# Patient Record
Sex: Female | Born: 1985 | Race: White | Hispanic: No | Marital: Single | Smoking: Former smoker
Health system: Southern US, Community
[De-identification: ages and names within clinical notes are randomized; demographics above are authoritative.]

## PROBLEM LIST (undated history)

## (undated) DIAGNOSIS — F909 Attention-deficit hyperactivity disorder, unspecified type: Secondary | ICD-10-CM

## (undated) DIAGNOSIS — Z789 Other specified health status: Secondary | ICD-10-CM

## (undated) DIAGNOSIS — E039 Hypothyroidism, unspecified: Secondary | ICD-10-CM

## (undated) HISTORY — PX: KNEE ARTHROSCOPY: SHX127

## (undated) HISTORY — PX: KNEE SURGERY: SHX244

---

## 2012-01-31 ENCOUNTER — Emergency Department (HOSPITAL_COMMUNITY)
Admission: EM | Admit: 2012-01-31 | Discharge: 2012-01-31 | Disposition: A | Discharge status: Home / Self Care | Payer: Self-pay | Attending: Emergency Medicine | Admitting: Emergency Medicine

## 2012-01-31 ENCOUNTER — Encounter (HOSPITAL_COMMUNITY): Payer: Self-pay | Admitting: *Deleted

## 2012-01-31 ENCOUNTER — Emergency Department (HOSPITAL_COMMUNITY): Payer: Self-pay

## 2012-01-31 DIAGNOSIS — R109 Unspecified abdominal pain: Secondary | ICD-10-CM | POA: Insufficient documentation

## 2012-01-31 DIAGNOSIS — N12 Tubulo-interstitial nephritis, not specified as acute or chronic: Secondary | ICD-10-CM | POA: Insufficient documentation

## 2012-01-31 LAB — CBC
Hemoglobin: 11.1 g/dL — ABNORMAL LOW (ref 12.0–15.0)
MCHC: 33.5 g/dL (ref 30.0–36.0)
RDW: 12.7 % (ref 11.5–15.5)
WBC: 10.3 10*3/uL (ref 4.0–10.5)

## 2012-01-31 LAB — URINALYSIS, ROUTINE W REFLEX MICROSCOPIC
Glucose, UA: NEGATIVE mg/dL
Leukocytes, UA: NEGATIVE
Nitrite: POSITIVE — AB
Protein, ur: 30 mg/dL — AB
pH: 6 (ref 5.0–8.0)

## 2012-01-31 LAB — URINE MICROSCOPIC-ADD ON

## 2012-01-31 LAB — POCT I-STAT, CHEM 8
Creatinine, Ser: 0.7 mg/dL (ref 0.50–1.10)
Glucose, Bld: 80 mg/dL (ref 70–99)
HCT: 33 % — ABNORMAL LOW (ref 36.0–46.0)
Hemoglobin: 11.2 g/dL — ABNORMAL LOW (ref 12.0–15.0)
TCO2: 23 mmol/L (ref 0–100)

## 2012-01-31 LAB — PREGNANCY, URINE: Preg Test, Ur: NEGATIVE

## 2012-01-31 MED ORDER — DEXTROSE 5 % IV SOLN
1.0000 g | Freq: Once | INTRAVENOUS | Status: AC
  Administered 2012-01-31: 1 g via INTRAVENOUS
  Filled 2012-01-31: qty 10

## 2012-01-31 MED ORDER — PERCOCET 5-325 MG PO TABS: 1.0000 | ORAL_TABLET | Freq: Four times a day (QID) | ORAL | Status: AC | PRN

## 2012-01-31 MED ORDER — ONDANSETRON 8 MG PO TBDP
8.0000 mg | ORAL_TABLET | Freq: Once | ORAL | Status: DC
  Filled 2012-01-31: qty 1

## 2012-01-31 MED ORDER — MORPHINE SULFATE 4 MG/ML IJ SOLN
4.0000 mg | Freq: Once | INTRAMUSCULAR | Status: AC
  Administered 2012-01-31: 4 mg via INTRAVENOUS
  Filled 2012-01-31: qty 1

## 2012-01-31 MED ORDER — SODIUM CHLORIDE 0.9 % IV BOLUS (SEPSIS)
1000.0000 mL | Freq: Once | INTRAVENOUS | Status: AC
  Administered 2012-01-31: 1000 mL via INTRAVENOUS

## 2012-01-31 MED ORDER — SODIUM CHLORIDE 0.9 % IV BOLUS (SEPSIS)
500.0000 mL | INTRAVENOUS | Status: AC
  Administered 2012-01-31: 500 mL via INTRAVENOUS

## 2012-01-31 MED ORDER — CEPHALEXIN 500 MG PO CAPS: 500.0000 mg | ORAL_CAPSULE | Freq: Four times a day (QID) | ORAL | Status: AC

## 2012-01-31 MED ORDER — OXYCODONE-ACETAMINOPHEN 5-325 MG PO TABS
2.0000 | ORAL_TABLET | Freq: Once | ORAL | Status: DC
  Filled 2012-01-31: qty 2

## 2012-01-31 MED ORDER — ONDANSETRON HCL 4 MG/2ML IJ SOLN
4.0000 mg | Freq: Once | INTRAMUSCULAR | Status: AC
  Administered 2012-01-31: 4 mg via INTRAVENOUS
  Filled 2012-01-31: qty 2

## 2012-01-31 MED ORDER — KETOROLAC TROMETHAMINE 30 MG/ML IJ SOLN
30.0000 mg | Freq: Once | INTRAMUSCULAR | Status: AC
  Administered 2012-01-31: 30 mg via INTRAVENOUS
  Filled 2012-01-31: qty 1

## 2012-01-31 NOTE — ED Provider Notes (Signed)
History     CSN: [id number]  Arrival date & time 01/31/12  4098   First MD Initiated Contact with Patient 01/31/12 0730      Chief Complaint  Patient presents with  . Flank Pain    (Consider location/radiation/quality/duration/timing/severity/associated sxs/prior treatment) HPI Comments: Patient with a significant past medical history presents emergency department with chief complaint of right-sided flank pain.  Onset of symptoms began on Friday, location is right flank, pain does not radiate, associated symptoms include nausea, decreased appetite, dysuria, urinary frequency.  Patient denies any abnormal vaginal discharge, dyspareunia, hematuria or vaginal bleeding.  Patient states that she is unaware whether the back pain or urinary symptoms began first.  She denies fevers, night sweats, chills, emesis.   Patient is a 26 y.o. female presenting with flank pain. The history is provided by the patient.  Flank Pain Pertinent negatives include no abdominal pain, chest pain, chills, coughing, diaphoresis, fatigue, fever, headaches, nausea, rash or vomiting.    History reviewed. No pertinent past medical history.  Past Surgical History  Procedure Date  . Knee surgery     No family history on file.  History  Substance Use Topics  . Smoking status: Never Smoker   . Smokeless tobacco: Not on file  . Alcohol Use: 1.8 oz/week    3 Cans of beer per week    OB History    Grav Para Term Preterm Abortions TAB SAB Ect Mult Living                  Review of Systems  Constitutional: Negative for fever, chills, diaphoresis and fatigue.  HENT: Negative for ear pain and sinus pressure.   Eyes: Negative for visual disturbance.  Respiratory: Negative for cough.   Cardiovascular: Negative for chest pain.  Gastrointestinal: Negative for nausea, vomiting and abdominal pain.  Genitourinary: Positive for dysuria, flank pain, decreased urine volume and difficulty urinating. Negative for  urgency, hematuria, vaginal bleeding, vaginal discharge, vaginal pain, menstrual problem and pelvic pain.  Skin: Negative for color change and rash.  Neurological: Negative for dizziness and headaches.  Psychiatric/Behavioral: Negative for confusion.  All other systems reviewed and are negative.    Allergies  Review of patient's allergies indicates no known allergies.  Home Medications  No current outpatient prescriptions on file.  BP 119/75  Pulse 95  Temp(Src) 98.9 F (37.2 C) (Oral)  SpO2 100%  Physical Exam  Nursing note and vitals reviewed. Constitutional: She is oriented to person, place, and time. She appears well-developed and well-nourished. She appears distressed.  HENT:  Head: Normocephalic and atraumatic.  Eyes: Conjunctivae and EOM are normal.  Neck: Normal range of motion. Neck supple.  Cardiovascular: Normal rate, regular rhythm and normal heart sounds.   Pulmonary/Chest: Effort normal.  Abdominal: Soft. Normal appearance and bowel sounds are normal. She exhibits no distension, no ascites and no pulsatile midline mass. There is tenderness (CVA tenderness). There is CVA tenderness.  Musculoskeletal: Normal range of motion.  Neurological: She is alert and oriented to person, place, and time.  Skin: Skin is warm and dry. She is not diaphoretic.  Psychiatric: She has a normal mood and affect. Her behavior is normal.    ED Course  Procedures (including critical care time)  Labs Reviewed  URINALYSIS, ROUTINE W REFLEX MICROSCOPIC - Abnormal; Notable for the following:    Color, Urine AMBER (*) BIOCHEMICALS MAY BE AFFECTED BY COLOR   APPearance CLOUDY (*)    Specific Gravity, Urine 1.036 (*)  Bilirubin Urine SMALL (*)    Ketones, ur TRACE (*)    Protein, ur 30 (*)    Nitrite POSITIVE (*)    All other components within normal limits  CBC - Abnormal; Notable for the following:    RBC 3.68 (*)    Hemoglobin 11.1 (*)    HCT 33.1 (*)    All other components  within normal limits  URINE MICROSCOPIC-ADD ON - Abnormal; Notable for the following:    Squamous Epithelial / LPF MANY (*)    Bacteria, UA MANY (*)    All other components within normal limits  POCT I-STAT, CHEM 8 - Abnormal; Notable for the following:    Hemoglobin 11.2 (*)    HCT 33.0 (*)    All other components within normal limits  PREGNANCY, URINE   US Renal  01/31/2012  *RADIOLOGY REPORT*  Clinical Data: Flank pain  RENAL/URINARY TRACT ULTRASOUND COMPLETE  Comparison:  None.  Findings:  Right Kidney:  The right kidney is normal in size and echotexture. It measures approximately 9.4 cm in length.  A single 2mm echogenic focus is seen in the mid pole of the right kidney.  No definite associated shadowing with this echogenic focus;  a small stone cannot be excluded, but is not certain.  There is no hydronephrosis.  Left Kidney:  The left kidney measures 10.4 cm in length. Echogenicity of the left kidney appears normal. There is a 4 x 1 mm echogenic focus in the mid to lower pole of the left kidney without definite shadowing.  A tiny nonobstructing stone cannot be excluded, but is not definite.  Bladder:  There are some echogenic particles within the dependent portion of the urinary bladder that likely reflect debris.  Bladder wall appears normal in thickness.  The ureteral jets are visualized bilaterally.  No evidence of ureteral dilatation at the bladder base.  IMPRESSION:  1.  Negative for hydronephrosis.  Normal echogenicity of the kidneys. 2.  Cannot exclude a tiny nonobstructing stone within each kidney. As there is no definite shadowing from these echogenic areas, stones are not definite. 3.  Debris within the dependent portion of the bladder.  Suggest correlation with urinalysis.  Original Report Authenticated By: Britta Mccreedy, M.D.     No diagnosis found.    MDM  Pyelonephritis  Pt has been diagnosed with a pyelonephritis. Pt is afebrile with CVA tenderness, normotensive, and  denies N/V. Pt to be dc home with antibiotics and instructions to follow up with PCP if symptoms persist. Treated in ED w rocephin and pain meds         Jaci Carrel, PA-C 01/31/12 1123

## 2012-01-31 NOTE — ED Notes (Signed)
Ultrasound at bedside

## 2012-01-31 NOTE — ED Notes (Signed)
Attempted in and out, no urine obtained, IV fluid currently going in, and pt offered water to drink. Will re-attempt after NS bolous. PA lisette informed

## 2012-01-31 NOTE — ED Notes (Signed)
Pt states she has had a uti for five days. Pt states she has not been able to void for two days. Pt states when she was voiding she c/o burning. Pt also c/o n/v

## 2012-01-31 NOTE — ED Provider Notes (Signed)
Medical screening examination/treatment/procedure(s) were performed by non-physician practitioner and as supervising physician I was immediately available for consultation/collaboration.   Lyanne Co, MD 01/31/12 1125

## 2012-01-31 NOTE — Discharge Instructions (Signed)
Pyelonephritis, Adult Pyelonephritis is a kidney infection. In general, there are 2 main types of pyelonephritis:  Infections that come on quickly without any warning (acute pyelonephritis).   Infections that persist for a long period of time (chronic pyelonephritis).  CAUSES  Two main causes of pyelonephritis are:  Bacteria traveling from the bladder to the kidney. This is a problem especially in pregnant women. The urine in the bladder can become filled with bacteria from multiple causes, including:   Inflammation of the prostate gland (prostatitis).   Sexual intercourse in females.   Bladder infection (cystitis).   Bacteria traveling from the bloodstream to the tissue part of the kidney.  Problems that may increase your risk of getting a kidney infection include:  Diabetes.   Kidney stones or bladder stones.   Cancer.   Catheters placed in the bladder.   Other abnormalities of the kidney or ureter.  SYMPTOMS   Abdominal pain.   Pain in the side or flank area.   Fever.   Chills.   Upset stomach.   Blood in the urine (dark urine).   Frequent urination.   Strong or persistent urge to urinate.   Burning or stinging when urinating.  DIAGNOSIS  Your caregiver may diagnose your kidney infection based on your symptoms. A urine sample may also be taken. TREATMENT  In general, treatment depends on how severe the infection is.   If the infection is mild and caught early, your caregiver may treat you with oral antibiotics and send you home.   If the infection is more severe, the bacteria may have gotten into the bloodstream. This will require intravenous (IV) antibiotics and a hospital stay. Symptoms may include:   High fever.   Severe flank pain.   Shaking chills.   Even after a hospital stay, your caregiver may require you to be on oral antibiotics for a period of time.   Other treatments may be required depending upon the cause of the infection.  HOME CARE  INSTRUCTIONS   Take your antibiotics as directed. Finish them even if you start to feel better.   Make an appointment to have your urine checked to make sure the infection is gone.   Drink enough fluids to keep your urine clear or pale yellow.   Take medicines for the bladder if you have urgency and frequency of urination as directed by your caregiver.  SEEK IMMEDIATE MEDICAL CARE IF:   You have a fever.   You are unable to take your antibiotics or fluids.   You develop shaking chills.   You experience extreme weakness or fainting.   There is no improvement after 2 days of treatment.  MAKE SURE YOU:  Understand these instructions.   Will watch your condition.   Will get help right away if you are not doing well or get worse.  Document Released: 08/08/2005 Document Revised: 07/28/2011 Document Reviewed: 01/12/2011 [employer] Patient Information 2012 [employer]. 

## 2012-01-31 NOTE — ED Notes (Signed)
Will discharge pt after IV antibiotic

## 2013-02-20 IMAGING — US US RENAL
1 series · 13 of 25 positions shown · non-contrast
Comparison: None.

CLINICAL DATA: Flank pain

RENAL/URINARY TRACT ULTRASOUND COMPLETE

[Series 1: [facility] renal · 0.22mm/px · 13 of 37 slices shown]
[im 1/37]
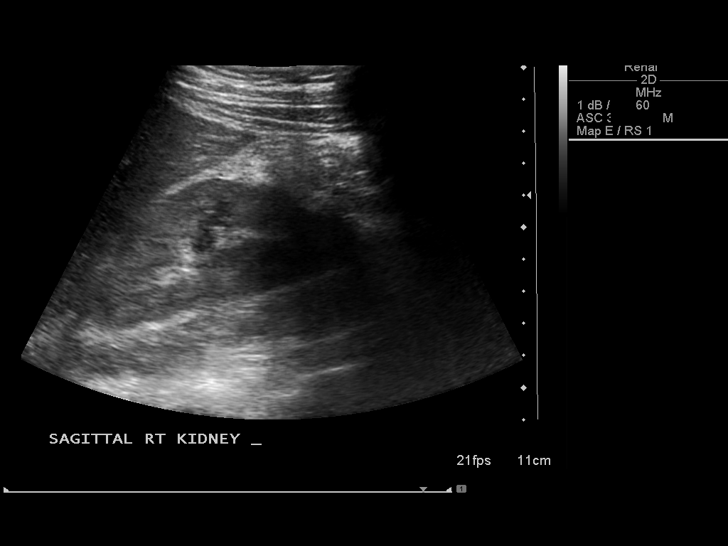
[im 4/37]
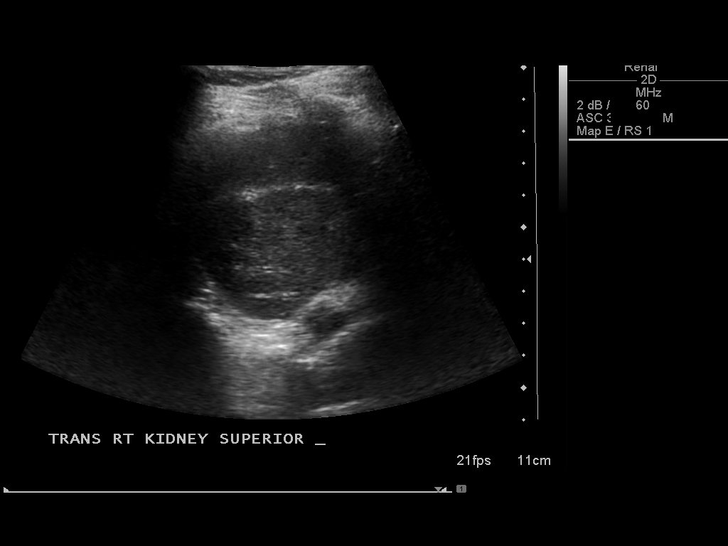
[im 7/37]
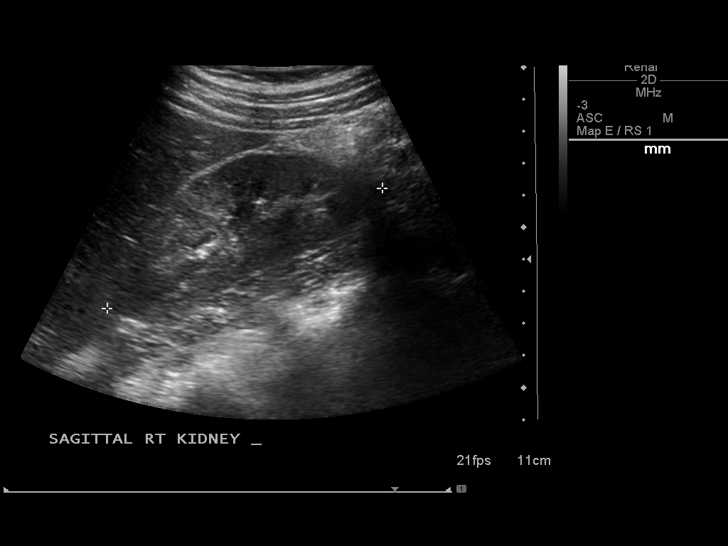
[im 10/37]
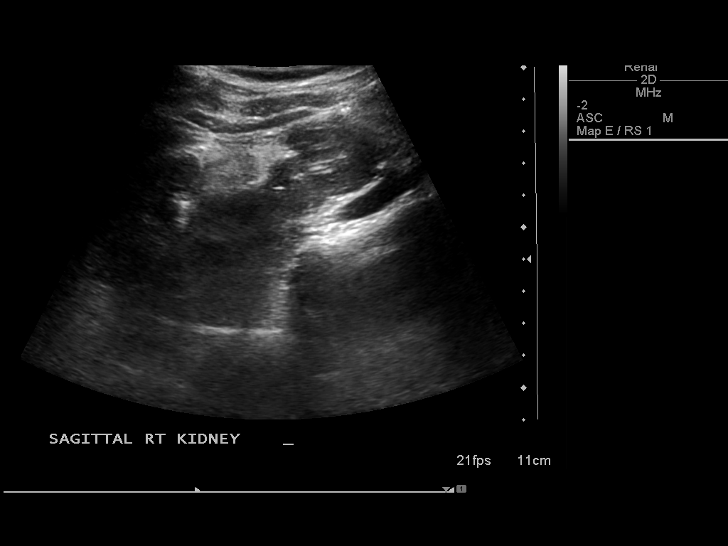
[im 13/37]
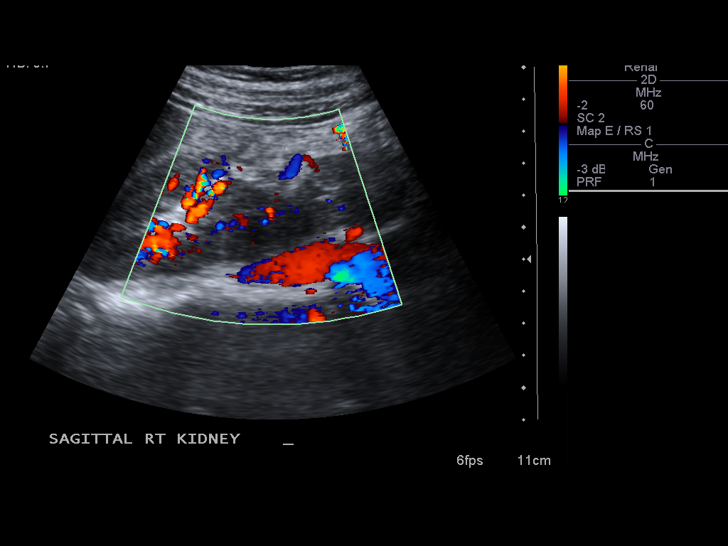
[im 16/37]
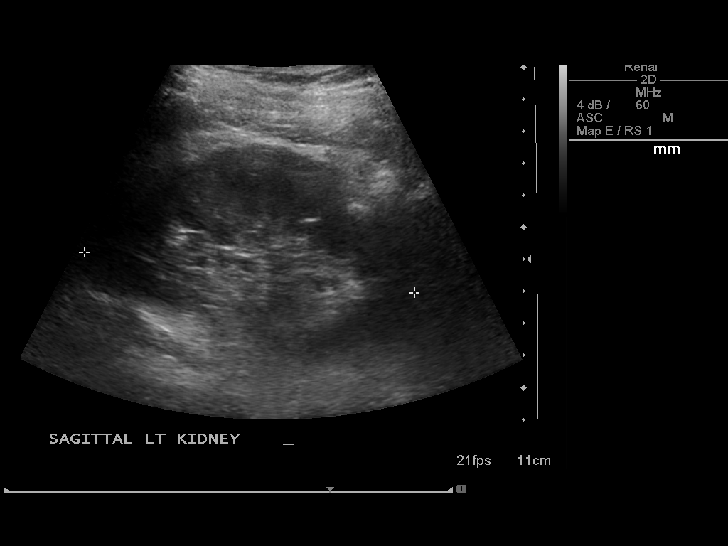
[im 19/37]
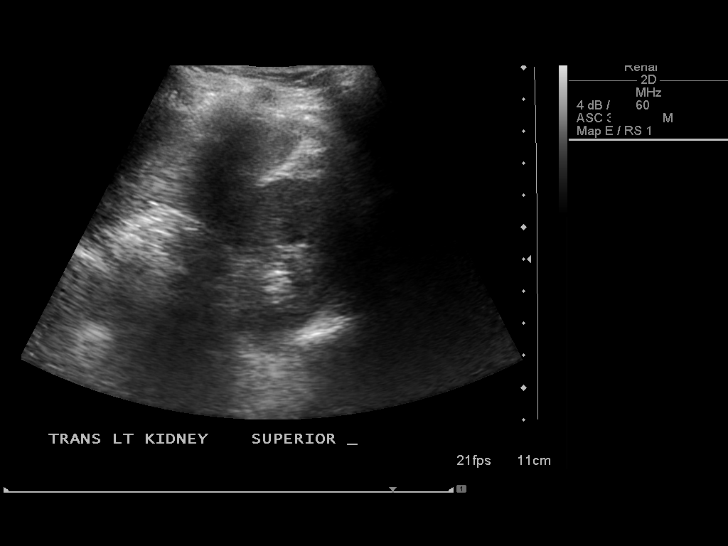
[im 22/37]
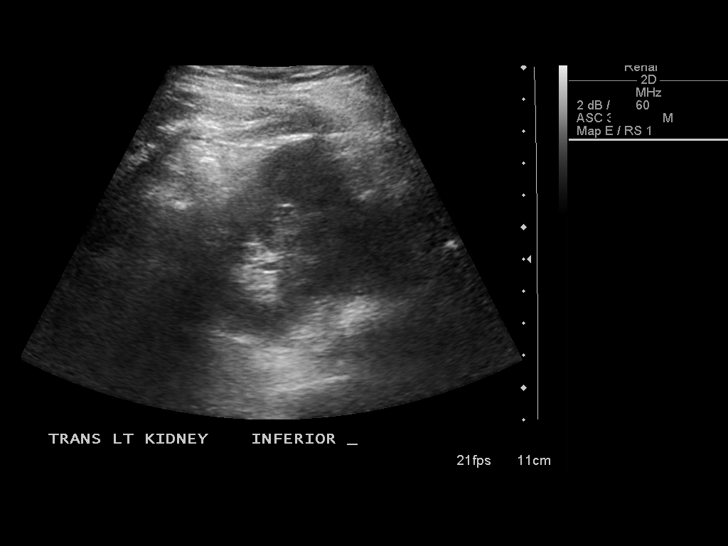
[im 25/37]
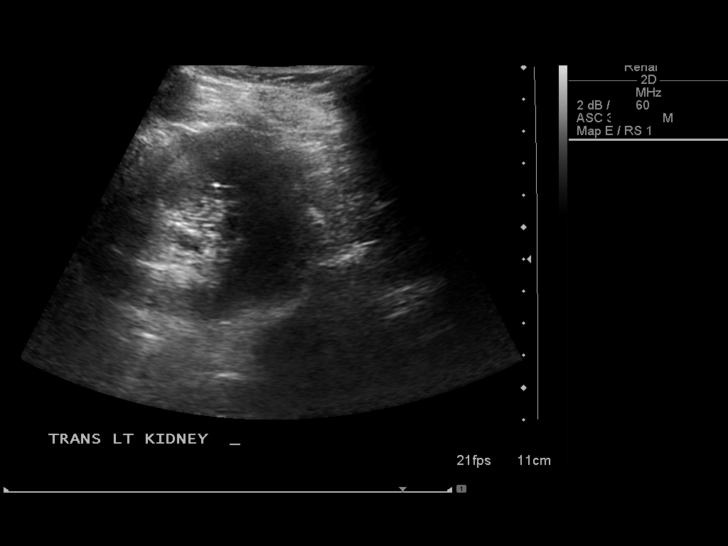
[im 28/37]
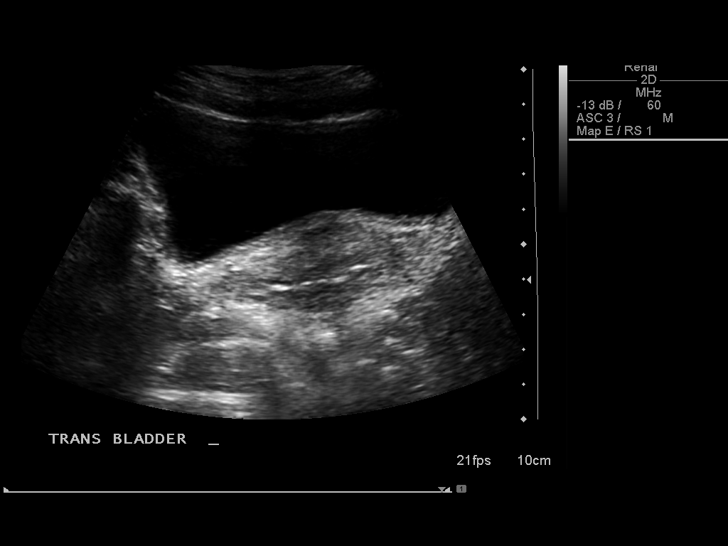
[im 31/37]
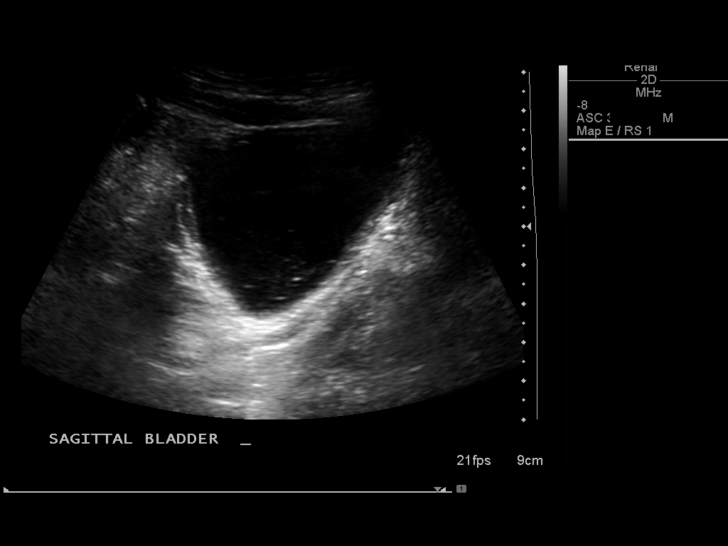
[im 34/37]
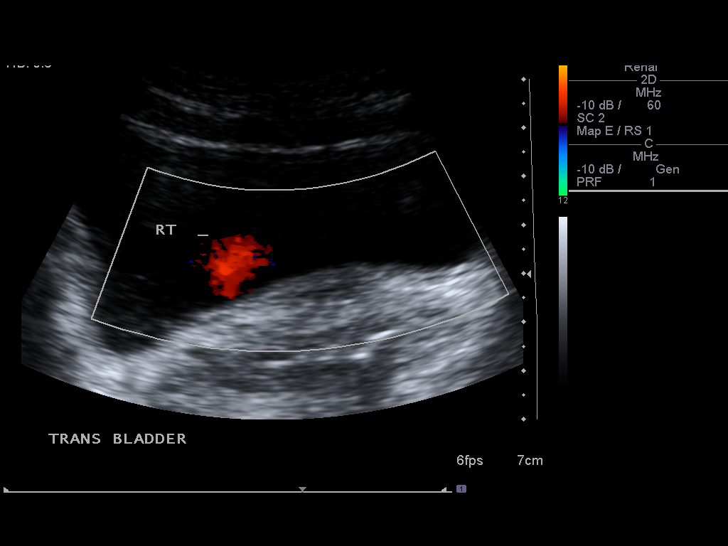
[im 37/37]
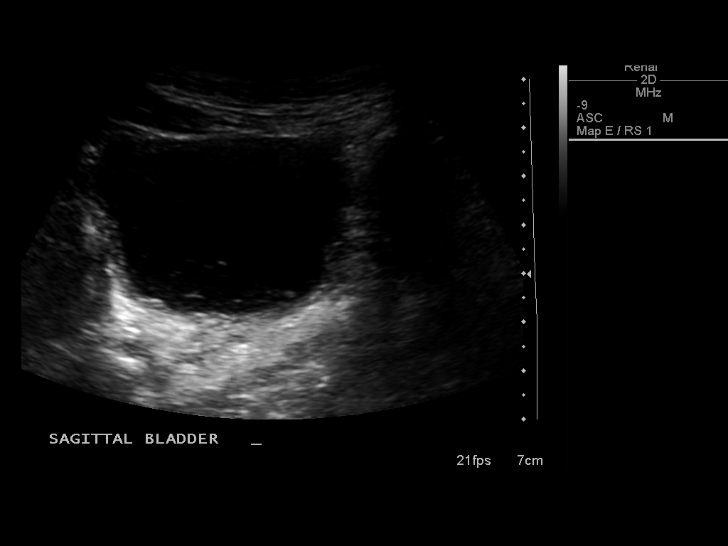

[13 of 25 positions shown; findings below may reference images not displayed]

FINDINGS: Right Kidney:  The right kidney is normal in size and echotexture.
It measures approximately 9.4 cm in length.  A single 2mm echogenic
focus is seen in the mid pole of the right kidney.  No definite
associated shadowing with this echogenic focus;  a small stone
cannot be excluded, but is not certain.  There is no
hydronephrosis.

Left Kidney:  The left kidney measures 10.4 cm in length.
Echogenicity of the left kidney appears normal. There is a 4 x 1 mm
echogenic focus in the mid to lower pole of the left kidney without
definite shadowing.  A tiny nonobstructing stone cannot be
excluded, but is not definite.

Bladder:  There are some echogenic particles within the dependent
portion of the urinary bladder that likely reflect debris.  Bladder
wall appears normal in thickness.  The ureteral jets are visualized
bilaterally.  No evidence of ureteral dilatation at the bladder
base.
IMPRESSION: 1.  Negative for hydronephrosis.  Normal echogenicity of the
kidneys.
2.  Cannot exclude a tiny nonobstructing stone within each kidney.
As there is no definite shadowing from these echogenic areas,
stones are not definite.
3.  Debris within the dependent portion of the bladder.  Suggest
correlation with urinalysis.

## 2015-08-20 ENCOUNTER — Encounter (HOSPITAL_COMMUNITY): Payer: Self-pay | Admitting: *Deleted

## 2015-08-20 ENCOUNTER — Inpatient Hospital Stay (HOSPITAL_COMMUNITY)
Admission: AD | Admit: 2015-08-20 | Discharge: 2015-08-20 | Disposition: A | Discharge status: Home / Self Care | Payer: Medicaid Other | Source: Ambulatory Visit | Attending: Obstetrics & Gynecology | Admitting: Obstetrics & Gynecology

## 2015-08-20 ENCOUNTER — Inpatient Hospital Stay (HOSPITAL_COMMUNITY): Payer: Medicaid Other

## 2015-08-20 DIAGNOSIS — Z3A09 9 weeks gestation of pregnancy: Secondary | ICD-10-CM | POA: Insufficient documentation

## 2015-08-20 DIAGNOSIS — O039 Complete or unspecified spontaneous abortion without complication: Secondary | ICD-10-CM | POA: Insufficient documentation

## 2015-08-20 DIAGNOSIS — R55 Syncope and collapse: Secondary | ICD-10-CM | POA: Insufficient documentation

## 2015-08-20 DIAGNOSIS — R109 Unspecified abdominal pain: Secondary | ICD-10-CM | POA: Insufficient documentation

## 2015-08-20 DIAGNOSIS — O209 Hemorrhage in early pregnancy, unspecified: Secondary | ICD-10-CM

## 2015-08-20 DIAGNOSIS — O9989 Other specified diseases and conditions complicating pregnancy, childbirth and the puerperium: Secondary | ICD-10-CM | POA: Diagnosis not present

## 2015-08-20 DIAGNOSIS — F17200 Nicotine dependence, unspecified, uncomplicated: Secondary | ICD-10-CM | POA: Diagnosis not present

## 2015-08-20 DIAGNOSIS — R102 Pelvic and perineal pain: Secondary | ICD-10-CM | POA: Diagnosis not present

## 2015-08-20 HISTORY — DX: Other specified health status: Z78.9

## 2015-08-20 LAB — URINALYSIS, ROUTINE W REFLEX MICROSCOPIC
GLUCOSE, UA: NEGATIVE mg/dL
Hgb urine dipstick: NEGATIVE
KETONES UR: 40 mg/dL — AB
LEUKOCYTES UA: NEGATIVE
NITRITE: NEGATIVE
PH: 6 (ref 5.0–8.0)
Protein, ur: NEGATIVE mg/dL
SPECIFIC GRAVITY, URINE: 1.025 (ref 1.005–1.030)

## 2015-08-20 LAB — CBC WITH DIFFERENTIAL/PLATELET
Basophils Absolute: 0 10*3/uL (ref 0.0–0.1)
Basophils Relative: 0 %
EOS ABS: 0.1 10*3/uL (ref 0.0–0.7)
Eosinophils Relative: 1 %
HCT: 33.3 % — ABNORMAL LOW (ref 36.0–46.0)
Hemoglobin: 11.5 g/dL — ABNORMAL LOW (ref 12.0–15.0)
LYMPHS ABS: 1.6 10*3/uL (ref 0.7–4.0)
LYMPHS PCT: 16 %
MCH: 30.6 pg (ref 26.0–34.0)
MCHC: 34.5 g/dL (ref 30.0–36.0)
MCV: 88.6 fL (ref 78.0–100.0)
Monocytes Absolute: 0.6 10*3/uL (ref 0.1–1.0)
Monocytes Relative: 6 %
NEUTROS ABS: 8 10*3/uL — AB (ref 1.7–7.7)
NEUTROS PCT: 77 %
PLATELETS: 326 10*3/uL (ref 150–400)
RBC: 3.76 MIL/uL — AB (ref 3.87–5.11)
RDW: 12.5 % (ref 11.5–15.5)
WBC: 10.3 10*3/uL (ref 4.0–10.5)

## 2015-08-20 LAB — RAPID URINE DRUG SCREEN, HOSP PERFORMED
Amphetamines: POSITIVE — AB
BARBITURATES: NOT DETECTED
Benzodiazepines: NOT DETECTED
Cocaine: NOT DETECTED
OPIATES: NOT DETECTED
TETRAHYDROCANNABINOL: POSITIVE — AB

## 2015-08-20 LAB — ABO/RH: ABO/RH(D): A POS

## 2015-08-20 LAB — POCT PREGNANCY, URINE: Preg Test, Ur: POSITIVE — AB

## 2015-08-20 LAB — HCG, QUANTITATIVE, PREGNANCY: hCG, Beta Chain, Quant, S: 13951 m[IU]/mL — ABNORMAL HIGH (ref <5)

## 2015-08-20 MED ORDER — IBUPROFEN 600 MG PO TABS: 600.0000 mg | ORAL_TABLET | Freq: Four times a day (QID) | ORAL | Status: DC | PRN

## 2015-08-20 MED ORDER — HYDROMORPHONE HCL 1 MG/ML IJ SOLN
1.0000 mg | INTRAMUSCULAR | Status: AC
  Administered 2015-08-20: 1 mg via INTRAVENOUS
  Filled 2015-08-20: qty 1

## 2015-08-20 NOTE — MAU Note (Signed)
Pt arrived by EMS, C/O heavy bleeding & severe abdominal pain.  Pt states she was supposed to schedule a D&C with her OB in [place] for next week, is having a miscarriage.

## 2015-08-20 NOTE — Discharge Instructions (Signed)
Miscarriage °A miscarriage is the sudden loss of an unborn baby (fetus) before the 20th week of pregnancy. Most miscarriages happen in the first 3 months of pregnancy. Sometimes, it happens before a woman even knows she is pregnant. A miscarriage is also called a "spontaneous miscarriage" or "early pregnancy loss." Having a miscarriage can be an emotional experience. Talk with your caregiver about any questions you may have about miscarrying, the grieving process, and your future pregnancy plans. °CAUSES  °· Problems with the fetal chromosomes that make it impossible for the baby to develop normally. Problems with the baby's genes or chromosomes are most often the result of errors that occur, by chance, as the embryo divides and grows. The problems are not inherited from the parents. °· Infection of the cervix or uterus.   °· Hormone problems.   °· Problems with the cervix, such as having an incompetent cervix. This is when the tissue in the cervix is not strong enough to hold the pregnancy.   °· Problems with the uterus, such as an abnormally shaped uterus, uterine fibroids, or congenital abnormalities.   °· Certain medical conditions.   °· Smoking, drinking alcohol, or taking illegal drugs.   °· Trauma.   °Often, the cause of a miscarriage is unknown.  °SYMPTOMS  °· Vaginal bleeding or spotting, with or without cramps or pain. °· Pain or cramping in the abdomen or lower back. °· Passing fluid, tissue, or blood clots from the vagina. °DIAGNOSIS  °Your caregiver will perform a physical exam. You may also have an ultrasound to confirm the miscarriage. Blood or urine tests may also be ordered. °TREATMENT  °· Sometimes, treatment is not necessary if you naturally pass all the fetal tissue that was in the uterus. If some of the fetus or placenta remains in the body (incomplete miscarriage), tissue left behind may become infected and must be removed. Usually, a dilation and curettage (D and C) procedure is performed.  During a D and C procedure, the cervix is widened (dilated) and any remaining fetal or placental tissue is gently removed from the uterus. °· Antibiotic medicines are prescribed if there is an infection. Other medicines may be given to reduce the size of the uterus (contract) if there is a lot of bleeding. °· If you have Rh negative blood and your baby was Rh positive, you will need a Rh immunoglobulin shot. This shot will protect any future baby from having Rh blood problems in future pregnancies. °HOME CARE INSTRUCTIONS  °· Your caregiver may order bed rest or may allow you to continue light activity. Resume activity as directed by your caregiver. °· Have someone help with home and family responsibilities during this time.   °· Keep track of the number of sanitary pads you use each day and how soaked (saturated) they are. Write down this information.   °· Do not use tampons. Do not douche or have sexual intercourse until approved by your caregiver.   °· Only take over-the-counter or prescription medicines for pain or discomfort as directed by your caregiver.   °· Do not take aspirin. Aspirin can cause bleeding.   °· Keep all follow-up appointments with your caregiver.   °· If you or your partner have problems with grieving, talk to your caregiver or seek counseling to help cope with the pregnancy loss. Allow enough time to grieve before trying to get pregnant again.   °SEEK IMMEDIATE MEDICAL CARE IF:  °· You have severe cramps or pain in your back or abdomen. °· You have a fever. °· You pass large blood clots (walnut-sized   or larger) or tissue from your vagina. Save any tissue for your caregiver to inspect.   °· Your bleeding increases.   °· You have a thick, bad-smelling vaginal discharge. °· You become lightheaded, weak, or you faint.   °· You have chills.   °MAKE SURE YOU: °· Understand these instructions. °· Will watch your condition. °· Will get help right away if you are not doing well or get worse. °  °This  information is not intended to replace advice given to you by your health care provider. Make sure you discuss any questions you have with your health care provider. °  °Document Released: 02/01/2001 Document Revised: 12/03/2012 Document Reviewed: 09/27/2011 °Elsevier Interactive Patient Education ©2016 [employer] ° ° ° °Pelvic Rest °Pelvic rest is sometimes recommended for women when:  °· The placenta is partially or completely covering the opening of the cervix (placenta previa). °· There is bleeding between the uterine wall and the amniotic sac in the first trimester (subchorionic hemorrhage). °· The cervix begins to open without labor starting (incompetent cervix, cervical insufficiency). °· The labor is too early (preterm labor). °HOME CARE INSTRUCTIONS °· Do not have sexual intercourse, stimulation, or an orgasm. °· Do not use tampons, douche, or put anything in the vagina. °· Do not lift anything over 10 pounds (4.5 kg). °· Avoid strenuous activity or straining your pelvic muscles. °SEEK MEDICAL CARE IF:  °· You have any vaginal bleeding during pregnancy. Treat this as a potential emergency. °· You have cramping pain felt low in the stomach (stronger than menstrual cramps). °· You notice vaginal discharge (watery, mucus, or bloody). °· You have a low, dull backache. °· There are regular contractions or uterine tightening. °SEEK IMMEDIATE MEDICAL CARE IF: °You have vaginal bleeding and have placenta previa.  °  °This information is not intended to replace advice given to you by your health care provider. Make sure you discuss any questions you have with your health care provider. °  °Document Released: 12/03/2010 Document Revised: 10/31/2011 Document Reviewed: 02/09/2015 °Elsevier Interactive Patient Education ©2016 [employer] ° °

## 2015-08-20 NOTE — MAU Provider Note (Signed)
History     CSN: [id number]  Arrival date and time: 08/20/15 [id number]   First Provider Initiated Contact with Patient 08/20/15 [phone]      Chief Complaint  Patient presents with  . Vaginal Bleeding  . Abdominal Pain   HPI Victoria Foster 29 y.o. G2P0010 @[redacted]w[redacted]d  presents to MAU with severe abdominal pain and vaginal bleeding.  She began bleeding early in December but it had stopped.  She then restarted bleeding 12/20 and went to another ED.  She was told she needed and D&C and was to have appt next week.  She awoke at 4am today with terrible bleeding and cramps..  She has also been fainting.  She is complaining of severe pain and extreme cold.  She is in too much pain to answer questions.    OB History    Gravida Para Term Preterm AB TAB SAB Ectopic Multiple Living   2    1  1          Past Medical History  Diagnosis Date  . Medical history non-contributory     Past Surgical History  Procedure Laterality Date  . Knee surgery      Family History  Problem Relation Age of Onset  . Alcohol abuse Neg Hx   . Arthritis Neg Hx   . Asthma Neg Hx   . Birth defects Neg Hx   . COPD Neg Hx   . Cancer Neg Hx   . Depression Neg Hx   . Diabetes Neg Hx   . Drug abuse Neg Hx   . Early death Neg Hx   . Hearing loss Neg Hx   . Heart disease Neg Hx   . Hyperlipidemia Neg Hx   . Hypertension Neg Hx   . Kidney disease Neg Hx   . Learning disabilities Neg Hx   . Mental illness Neg Hx   . Mental retardation Neg Hx   . Miscarriages / Stillbirths Neg Hx   . Stroke Neg Hx   . Vision loss Neg Hx   . Varicose Veins Neg Hx     Social History  Substance Use Topics  . Smoking status: Current Every Day Smoker -- 0.50 packs/day  . Smokeless tobacco: None  . Alcohol Use: 1.8 oz/week    3 Cans of beer per week    Allergies: No Known Allergies  No prescriptions prior to admission    ROS Pertinent ROS in HPI.  All other systems are negative.   Physical Exam   Blood pressure 109/70,  pulse 70, temperature 97.5 F (36.4 C), resp. rate 18.  Physical Exam  Constitutional: She is oriented to person, place, and time. She appears well-developed and well-nourished. No distress.  HENT:  Head: Normocephalic and atraumatic.  Eyes: Conjunctivae and EOM are normal.  Neck: Normal range of motion. Neck supple.  Cardiovascular: Normal rate and regular rhythm.   Respiratory: Effort normal and breath sounds normal. No respiratory distress.  GI: Soft. She exhibits no distension. There is no tenderness.  Genitourinary:  Mod amt of blood in vault.  No active bleeding  Musculoskeletal: Normal range of motion.  Neurological: She is alert and oriented to person, place, and time.  Skin: Skin is warm and dry.  Psychiatric: She has a normal mood and affect.   Results for orders placed or performed during the hospital encounter of 08/20/15 (from the past 24 hour(s))  Urinalysis, Routine w reflex microscopic (not [facility])     Status: Abnormal   Collection Time:  08/20/15  7:55 AM  Result Value Ref Range   Color, Urine YELLOW YELLOW   APPearance CLEAR CLEAR   Specific Gravity, Urine 1.025 1.005 - 1.030   pH 6.0 5.0 - 8.0   Glucose, UA NEGATIVE NEGATIVE mg/dL   Hgb urine dipstick NEGATIVE NEGATIVE   Bilirubin Urine SMALL (A) NEGATIVE   Ketones, ur 40 (A) NEGATIVE mg/dL   Protein, ur NEGATIVE NEGATIVE mg/dL   Nitrite NEGATIVE NEGATIVE   Leukocytes, UA NEGATIVE NEGATIVE  Pregnancy, urine POC     Status: Abnormal   Collection Time: 08/20/15  7:58 AM  Result Value Ref Range   Preg Test, Ur POSITIVE (A) NEGATIVE  CBC with Differential/Platelet     Status: Abnormal   Collection Time: 08/20/15  8:00 AM  Result Value Ref Range   WBC 10.3 4.0 - 10.5 K/uL   RBC 3.76 (L) 3.87 - 5.11 MIL/uL   Hemoglobin 11.5 (L) 12.0 - 15.0 g/dL   HCT 16.1 (L) 09.6 - 04.5 %   MCV 88.6 78.0 - 100.0 fL   MCH 30.6 26.0 - 34.0 pg   MCHC 34.5 30.0 - 36.0 g/dL   RDW 40.9 81.1 - 91.4 %   Platelets 326 150 - 400  K/uL   Neutrophils Relative % 77 %   Neutro Abs 8.0 (H) 1.7 - 7.7 K/uL   Lymphocytes Relative 16 %   Lymphs Abs 1.6 0.7 - 4.0 K/uL   Monocytes Relative 6 %   Monocytes Absolute 0.6 0.1 - 1.0 K/uL   Eosinophils Relative 1 %   Eosinophils Absolute 0.1 0.0 - 0.7 K/uL   Basophils Relative 0 %   Basophils Absolute 0.0 0.0 - 0.1 K/uL  ABO/Rh     Status: None (Preliminary result)   Collection Time: 08/20/15  8:00 AM  Result Value Ref Range   ABO/RH(D) A POS   hCG, quantitative, pregnancy     Status: Abnormal   Collection Time: 08/20/15  8:00 AM  Result Value Ref Range   hCG, Beta Chain, Quant, S 13951 (H) <5 mIU/mL  US Ob Comp Less 14 Wks  08/20/2015  CLINICAL DATA:  Severe abdominal and pelvic pain. Heavy vaginal bleeding. EXAM: OBSTETRIC <14 WK [place] AND TRANSVAGINAL OB US TECHNIQUE: Both transabdominal and transvaginal ultrasound examinations were performed for complete evaluation of the gestation as well as the maternal uterus, adnexal regions, and pelvic cul-de-sac. Transvaginal technique was performed to assess early pregnancy. COMPARISON:  None. FINDINGS: No intrauterine gestational sac or other fluid collection visualized in endometrial cavity. Endometrial thickness measures 16 mm. Uterus is retroverted. No fibroids identified. Both ovaries are normal in appearance. No adnexal mass or free fluid identified. IMPRESSION: Pregnancy location not visualized sonographically. Differential diagnosis includes recent spontaneous abortion, IUP too early to visualize, and non-visualized ectopic pregnancy. Recommend close follow up of quantitative B-HCG levels, and follow up US as clinically warranted. Electronically Signed   By: Myles Rosenthal M.D.   On: 08/20/2015 09:55   US Ob Transvaginal  08/20/2015  CLINICAL DATA:  Severe abdominal and pelvic pain. Heavy vaginal bleeding. EXAM: OBSTETRIC <14 WK [place] AND TRANSVAGINAL OB US TECHNIQUE: Both transabdominal and transvaginal ultrasound examinations were  performed for complete evaluation of the gestation as well as the maternal uterus, adnexal regions, and pelvic cul-de-sac. Transvaginal technique was performed to assess early pregnancy. COMPARISON:  None. FINDINGS: No intrauterine gestational sac or other fluid collection visualized in endometrial cavity. Endometrial thickness measures 16 mm. Uterus is retroverted. No fibroids identified. Both ovaries are  normal in appearance. No adnexal mass or free fluid identified. IMPRESSION: Pregnancy location not visualized sonographically. Differential diagnosis includes recent spontaneous abortion, IUP too early to visualize, and non-visualized ectopic pregnancy. Recommend close follow up of quantitative B-HCG levels, and follow up US as clinically warranted. Electronically Signed   By: Myles Rosenthal M.D.   On: 08/20/2015 09:55   MAU Course  Procedures  MDM Ectopic workup ordered to eval pain and bleeding in early pregnancy IV fluids and IM Dilaudid ordered to address symptoms of pain, fainting. Rh positive No concern for infection with absence of fever and WBC 10.3 Hemodynamically stable with hgb of 11.5 U/S with no evidence for pregnancy.  In presence of HCG at >13,000, suggests complete SAB.     Assessment and Plan  A:  1. SAB (spontaneous abortion)   2. Vaginal bleeding in pregnancy, first trimester    P: Discharge to home Ibuprofen  prn pain - rx sent F/u with regular OB/GYN - [place] Pelvic rest Patient may return to MAU as needed or if her condition were to change or worsen   Bertram Denver 08/20/2015, 8:15 AM

## 2016-06-24 ENCOUNTER — Encounter (HOSPITAL_COMMUNITY): Payer: Self-pay

## 2016-09-04 IMAGING — US US OB TRANSVAGINAL
1 series · 14 of 28 positions shown · non-contrast
Comparison: None.

CLINICAL DATA: Severe abdominal and pelvic pain. Heavy vaginal
bleeding.

EXAM:
OBSTETRIC <14 WK US AND TRANSVAGINAL OB US
TECHNIQUE: Both transabdominal and transvaginal ultrasound examinations were
performed for complete evaluation of the gestation as well as the
maternal uterus, adnexal regions, and pelvic cul-de-sac.
Transvaginal technique was performed to assess early pregnancy.

[Series 1: us ob transvaginal · 0.15mm/px · 14 of 71 slices shown]
[im 3/71]
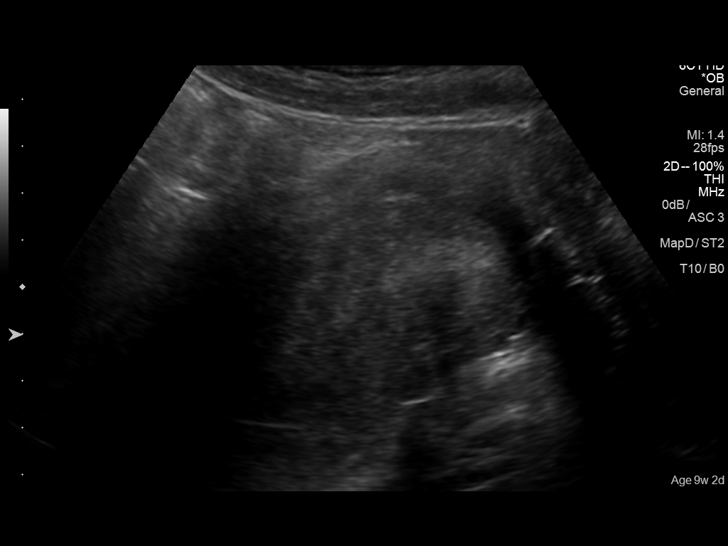
[im 8/71]
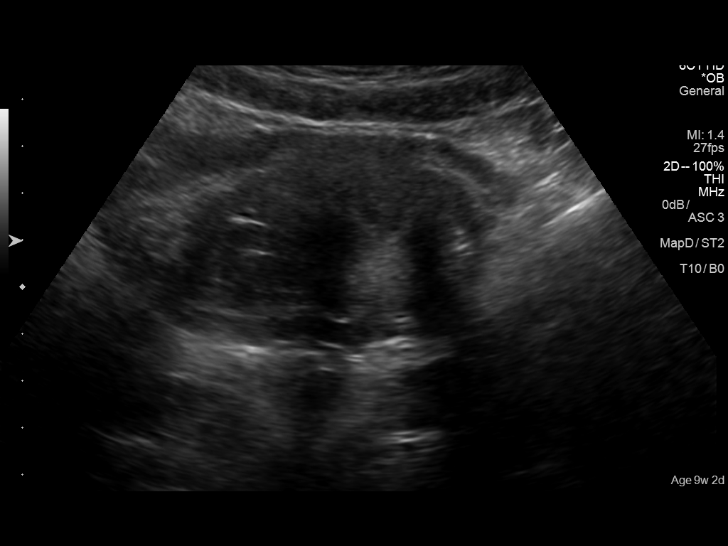
[im 13/71]
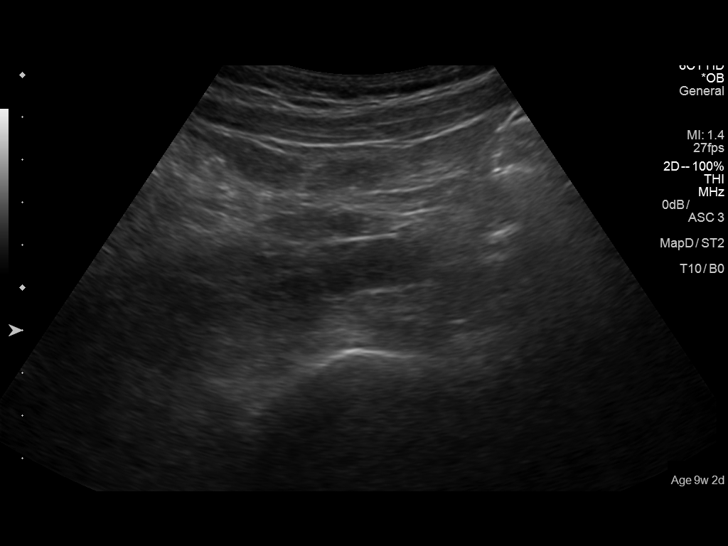
[im 19/71]
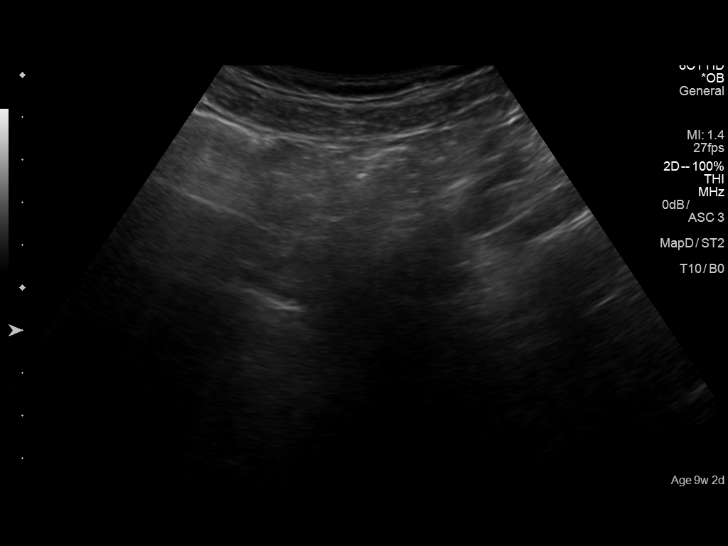
[im 24/71]
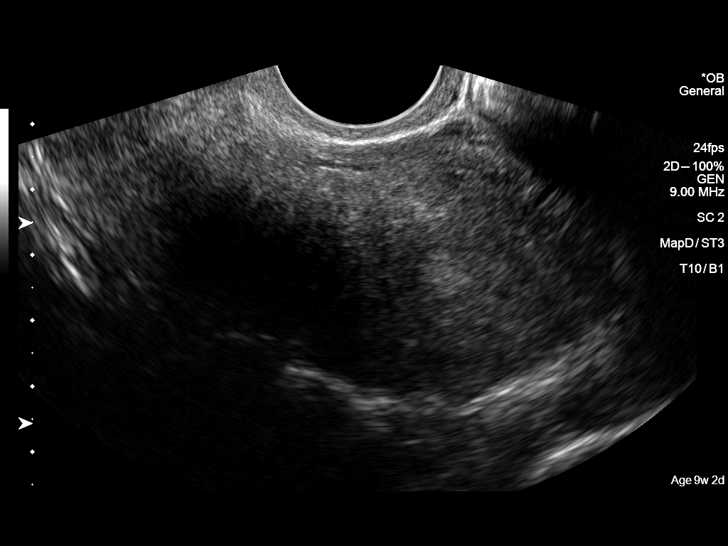
[im 29/71]
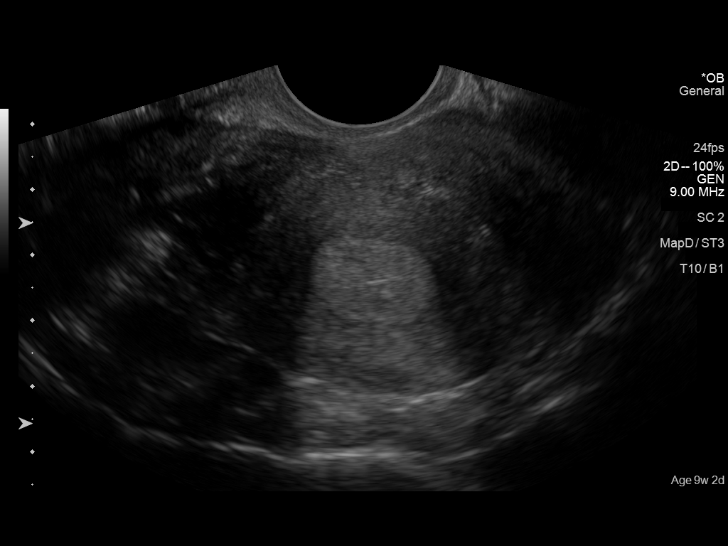
[im 34/71]
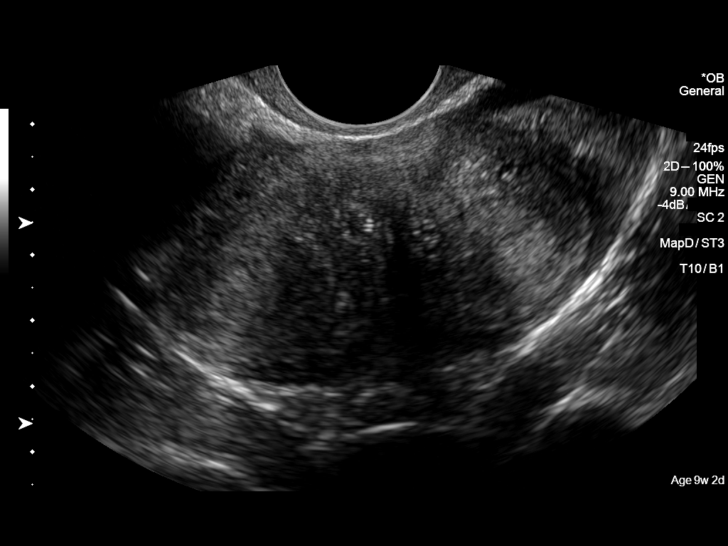
[im 39/71]
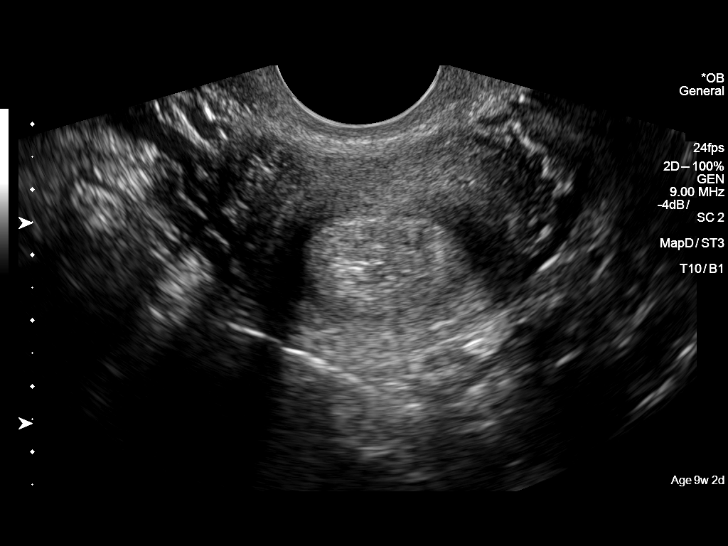
[im 45/71]
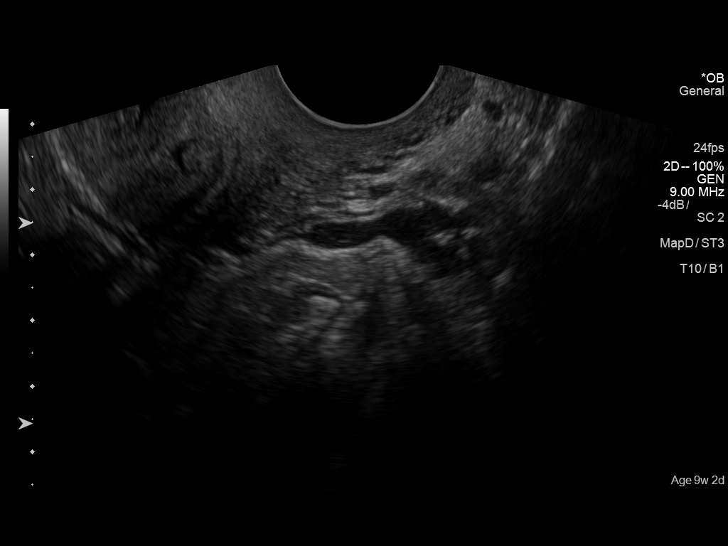
[im 50/71]
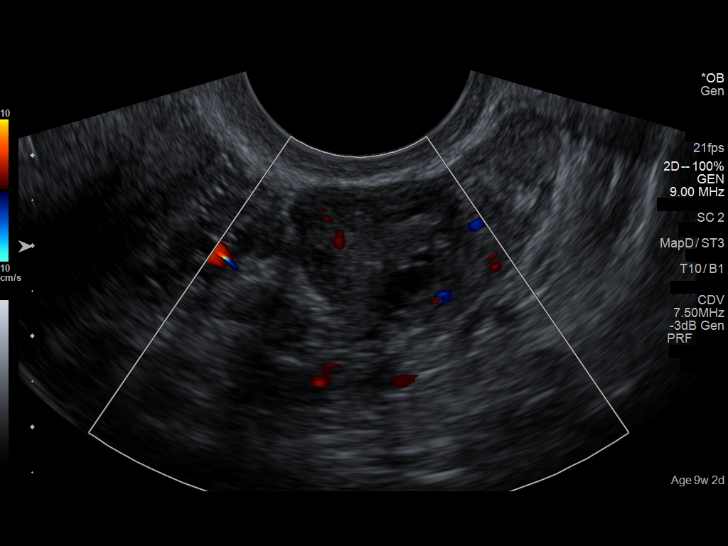
[im 55/71]
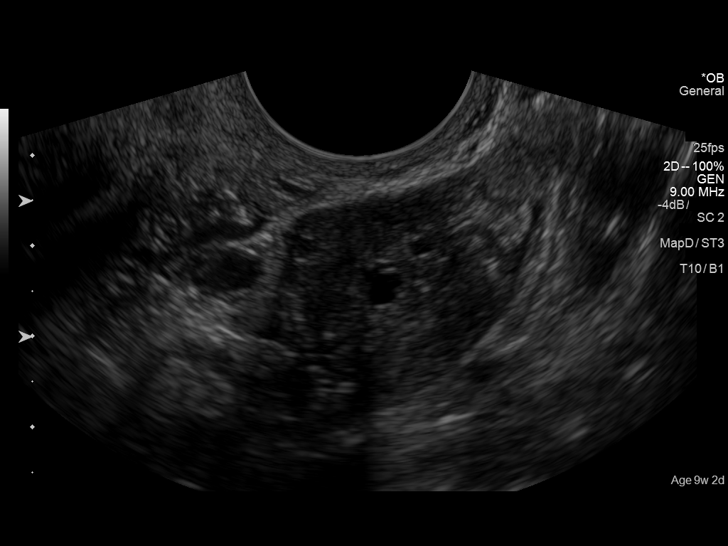
[im 60/71]
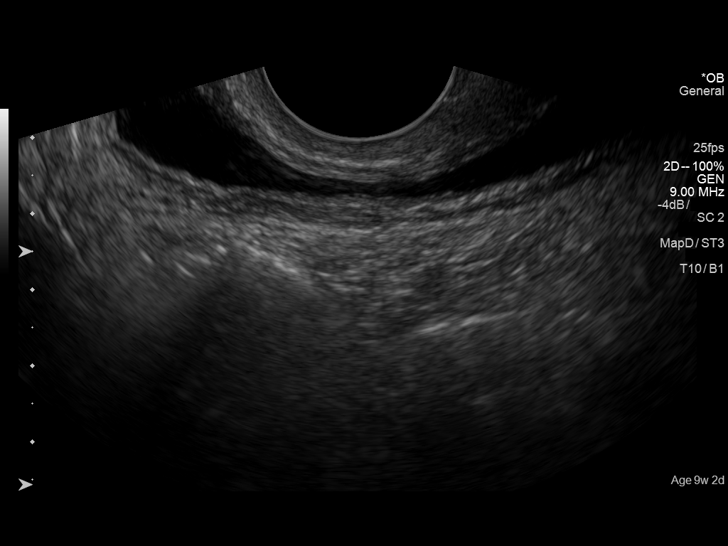
[im 65/71]
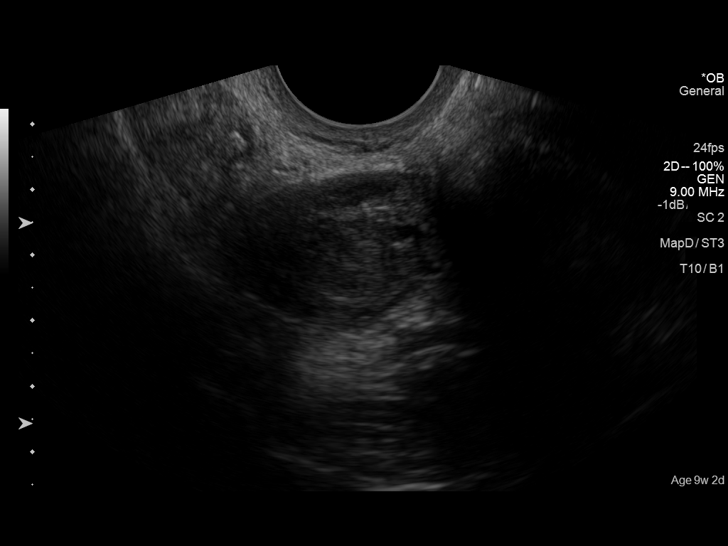
[im 71/71]
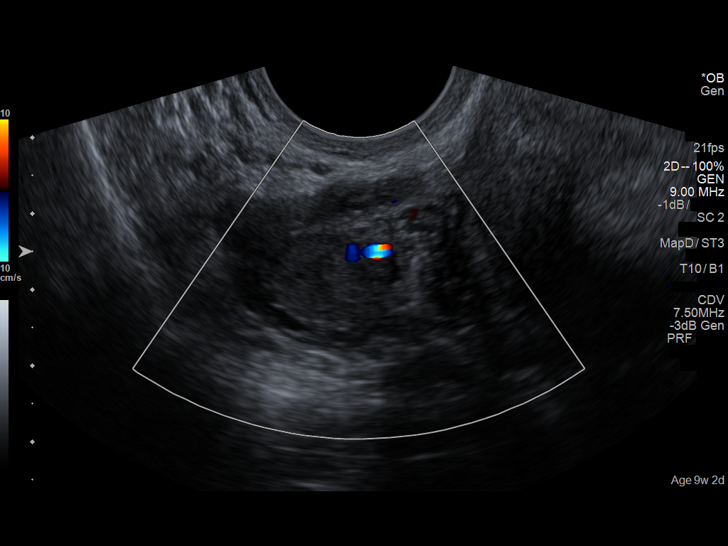

[14 of 28 positions shown; findings below may reference images not displayed]

FINDINGS: No intrauterine gestational sac or other fluid collection visualized
in endometrial cavity. Endometrial thickness measures 16 mm. Uterus
is retroverted. No fibroids identified.

Both ovaries are normal in appearance. No adnexal mass or free fluid
identified.
IMPRESSION: Pregnancy location not visualized sonographically. Differential
diagnosis includes recent spontaneous abortion, IUP too early to
visualize, and non-visualized ectopic pregnancy. Recommend close
follow up of quantitative B-HCG levels, and follow up US as
clinically warranted.

## 2018-09-23 ENCOUNTER — Emergency Department
Admission: EM | Admit: 2018-09-23 | Discharge: 2018-09-23 | Disposition: A | Discharge status: Home / Self Care | Payer: BLUE CROSS/BLUE SHIELD | Source: Home / Self Care | Attending: Family Medicine | Admitting: Family Medicine

## 2018-09-23 ENCOUNTER — Other Ambulatory Visit: Payer: Self-pay

## 2018-09-23 DIAGNOSIS — K0889 Other specified disorders of teeth and supporting structures: Secondary | ICD-10-CM | POA: Diagnosis not present

## 2018-09-23 HISTORY — DX: Hypothyroidism, unspecified: E03.9

## 2018-09-23 HISTORY — DX: Attention-deficit hyperactivity disorder, unspecified type: F90.9

## 2018-09-23 MED ORDER — TRAMADOL HCL 50 MG PO TABS: ORAL_TABLET | ORAL | 0 refills | Status: DC

## 2018-09-23 MED ORDER — AMOXICILLIN 875 MG PO TABS: 875.0000 mg | ORAL_TABLET | Freq: Two times a day (BID) | ORAL | 0 refills | Status: DC

## 2018-09-23 NOTE — ED Triage Notes (Signed)
Victoria Foster complains of tooth pain for 2 days. She does have a mouth lesion on the right side. She also reports ear pain for 1 day with difficulty hearing and a sore throat since this am. She reports the pain as a 10/10.

## 2018-09-23 NOTE — ED Provider Notes (Signed)
[facility]    CSN: [id number] Arrival date & time: 09/23/18  1231     History   Chief Complaint Chief Complaint  Patient presents with  . Ear Pain  . Mouth Lesions  . Sore Throat    HPI Victoria Foster is a 33 y.o. female.   Patient complains of pain/swelling around her right lower wisdom tooth during the past 2 days.  She has had several similar occurrences in the past.  She has also had right ear pain, decreased hearing, and sore throat since this morning.  She has also developed sweating, and increased sinus congestion today.  The history is provided by the patient.  Dental Pain  Location:  Lower Lower teeth location:  32/RL 3rd molar Quality:  Aching, constant and localized Severity:  Moderate Onset quality:  Sudden Duration:  2 days Timing:  Constant Progression:  Worsening Chronicity:  Recurrent Relieved by:  Nothing Worsened by:  Touching and pressure Ineffective treatments:  Acetaminophen and NSAIDs Associated symptoms: facial pain, gum swelling and oral lesions   Associated symptoms: no congestion, no difficulty swallowing, no drooling, no facial swelling, no fever, no headaches, no neck pain, no neck swelling, no oral bleeding and no trismus   Risk factors: lack of dental care     Past Medical History:  Diagnosis Date  . ADHD (attention deficit hyperactivity disorder)   . Hypothyroid   . Medical history non-contributory     There are no active problems to display for this patient.   Past Surgical History:  Procedure Laterality Date  . KNEE ARTHROSCOPY Left   . KNEE SURGERY      OB History    Gravida  2   Para      Term      Preterm      AB  1   Living        SAB  1   TAB      Ectopic      Multiple      Live Births               Home Medications    Prior to Admission medications   Medication Sig Start Date End Date Taking? Authorizing Provider  amphetamine-dextroamphetamine (ADDERALL) 15 MG tablet Take  15 mg by mouth daily. 03/28/18  Yes [provider]  ibuprofen (ADVIL,MOTRIN) 600 MG tablet Take 1 tablet (600 mg total) by mouth every 6 (six) hours as needed. 08/20/15  Yes Teague Edwena Blow, PA-C  letrozole [facility]) 2.5 MG tablet Take 2.5 mg by mouth daily. 09/12/18  Yes [provider]  levothyroxine (SYNTHROID, LEVOTHROID) 100 MCG tablet Take 100 mcg by mouth daily. 05/15/18 01/11/19 Yes [provider]  omeprazole (PRILOSEC) 20 MG capsule Take 20 mg by mouth daily. 09/12/18  Yes [provider]  amoxicillin (AMOXIL) 875 MG tablet Take 1 tablet (875 mg total) by mouth 2 (two) times daily. 09/23/18   Lattie Haw, MD  traMADol (ULTRAM) 50 MG tablet Take one tab PO HS for pain.  May repeat in 4 to 6 hours prn. 09/23/18   Lattie Haw, MD    Family History Family History  Problem Relation Age of Onset  . Hypertension Mother   . Thyroid disease Mother   . Hyperlipidemia Mother   . Heart disease Father   . Alcohol abuse Neg Hx   . Arthritis Neg Hx   . Asthma Neg Hx   . Birth defects Neg Hx   .  COPD Neg Hx   . Cancer Neg Hx   . Depression Neg Hx   . Diabetes Neg Hx   . Drug abuse Neg Hx   . Early death Neg Hx   . Hearing loss Neg Hx   . Kidney disease Neg Hx   . Learning disabilities Neg Hx   . Mental illness Neg Hx   . Mental retardation Neg Hx   . Miscarriages / Stillbirths Neg Hx   . Stroke Neg Hx   . Vision loss Neg Hx   . Varicose Veins Neg Hx     Social History Social History   Tobacco Use  . Smoking status: Former Smoker    Packs/day: 0.02    Years: 5.00    Pack years: 0.10  . Smokeless tobacco: Never Used  Substance Use Topics  . Alcohol use: Yes    Alcohol/week: 3.0 standard drinks    Types: 3 Cans of beer per week  . Drug use: Not Currently    Types: Marijuana     Allergies   Patient has no known allergies.   Review of Systems Review of Systems  Constitutional: Positive for activity change, diaphoresis and  fatigue. Negative for fever.  HENT: Positive for ear pain, mouth sores and sore throat. Negative for congestion, drooling, facial swelling, sinus pain and trouble swallowing.   Eyes: Negative.   Respiratory: Negative.   Cardiovascular: Negative.   Gastrointestinal: Negative.   Genitourinary: Negative.   Musculoskeletal: Negative for arthralgias and neck pain.  Skin: Negative.   Neurological: Negative for headaches.     Physical Exam Triage Vital Signs ED Triage Vitals  Enc Vitals Group     BP 09/23/18 1326 121/77     Pulse Rate 09/23/18 1326 93     Resp 09/23/18 1326 17     Temp 09/23/18 1326 98 F (36.7 C)     Temp Source 09/23/18 1326 Oral     SpO2 09/23/18 1326 99 %     Weight 09/23/18 1327 123 lb (55.8 kg)     Height 09/23/18 1327 5' (1.524 m)     Head Circumference --      Peak Flow --      Pain Score 09/23/18 1326 10     Pain Loc --      Pain Edu? --      Excl. in GC? --    No data found.  Updated Vital Signs BP 121/77 (BP Location: Right Arm)   Pulse 93   Temp 98 F (36.7 C) (Oral)   Resp 17   Ht 5' (1.524 m)   Wt 55.8 kg   SpO2 99%   BMI 24.02 kg/m   Visual Acuity Right Eye Distance:   Left Eye Distance:   Bilateral Distance:    Right Eye Near:   Left Eye Near:    Bilateral Near:     Physical Exam Vitals signs and nursing note reviewed.  Constitutional:      General: She is not in acute distress.    Appearance: She is not ill-appearing.  HENT:     Right Ear: Tympanic membrane, ear canal and external ear normal.     Left Ear: Tympanic membrane, ear canal and external ear normal.     Nose: Nose normal.     Mouth/Throat:     Mouth: Mucous membranes are moist.     Dentition: Dental tenderness and gingival swelling present.     Pharynx: No posterior oropharyngeal erythema.  Comments: There is gingival tenderness and swelling at tooth #32.  There is mild tenderness over the right parotid gland without swelling. Eyes:      Conjunctiva/sclera: Conjunctivae normal.     Pupils: Pupils are equal, round, and reactive to light.  Cardiovascular:     Heart sounds: Normal heart sounds.  Pulmonary:     Breath sounds: Normal breath sounds.  Lymphadenopathy:     Cervical: Cervical adenopathy present.  Skin:    General: Skin is warm and dry.     Findings: No rash.  Neurological:     Mental Status: She is alert.      UC Treatments / Results  Labs (all labs ordered are listed, but only abnormal results are displayed) Labs Reviewed -   Tympanometry:  Right ear tympanogram normal; Left ear tympanogram normal  EKG None  Radiology No results found.  Procedures Procedures (including critical care time)  Medications Ordered in UC Medications - No data to display  Initial Impression / Assessment and Plan / UC Course  I have reviewed the triage vital signs and the nursing notes.  Pertinent labs & imaging results that were available during my care of the patient were reviewed by me and considered in my medical decision making (see chart for details).    Begin amoxicillin.  Rx for Tramadol at bedtime prn (#10, no refill) Followup with dentist if not improving 10 days.   Final Clinical Impressions(s) / UC Diagnoses   Final diagnoses:  Pain, dental   Discharge Instructions   None    ED Prescriptions    Medication Sig Dispense Auth. Provider   amoxicillin (AMOXIL) 875 MG tablet Take 1 tablet (875 mg total) by mouth 2 (two) times daily. 20 tablet Lattie HawBeese, Graci Hulce A, MD   traMADol (ULTRAM) 50 MG tablet Take one tab PO HS for pain.  May repeat in 4 to 6 hours prn. 10 tablet Lattie HawBeese, Oralee Rapaport A, MD        Lattie HawBeese, Gargi Berch A, MD 09/24/18 [phone]

## 2018-09-23 NOTE — Discharge Instructions (Addendum)
May continue ibuprofen or Tylenol as needed for pain.

## 2023-01-05 LAB — HEPATITIS C ANTIBODY: HCV Ab: NEGATIVE

## 2023-01-05 LAB — OB RESULTS CONSOLE RUBELLA ANTIBODY, IGM: Rubella: IMMUNE

## 2023-01-05 LAB — OB RESULTS CONSOLE HEPATITIS B SURFACE ANTIGEN: Hepatitis B Surface Ag: NEGATIVE

## 2023-01-11 LAB — OB RESULTS CONSOLE GC/CHLAMYDIA
Chlamydia: NEGATIVE
Neisseria Gonorrhea: NEGATIVE

## 2023-05-14 LAB — OB RESULTS CONSOLE HIV ANTIBODY (ROUTINE TESTING): HIV: NONREACTIVE

## 2023-05-14 LAB — OB RESULTS CONSOLE RPR: RPR: NONREACTIVE

## 2023-07-16 LAB — OB RESULTS CONSOLE GBS: GBS: NEGATIVE

## 2023-08-05 ENCOUNTER — Encounter (HOSPITAL_COMMUNITY)
Admission: AD | Disposition: A | Discharge status: Home / Self Care | Payer: Self-pay | Source: Home / Self Care | Attending: Obstetrics and Gynecology

## 2023-08-05 ENCOUNTER — Inpatient Hospital Stay (HOSPITAL_COMMUNITY)
Admission: AD | Admit: 2023-08-05 | Discharge: 2023-08-08 | DRG: 788 | Disposition: A | Discharge status: Home / Self Care | Payer: BC Managed Care – PPO | Attending: Obstetrics and Gynecology | Admitting: Obstetrics and Gynecology

## 2023-08-05 ENCOUNTER — Encounter (HOSPITAL_COMMUNITY): Payer: Self-pay

## 2023-08-05 ENCOUNTER — Other Ambulatory Visit: Payer: Self-pay

## 2023-08-05 ENCOUNTER — Inpatient Hospital Stay (HOSPITAL_COMMUNITY): Payer: BC Managed Care – PPO | Admitting: Anesthesiology

## 2023-08-05 DIAGNOSIS — O99284 Endocrine, nutritional and metabolic diseases complicating childbirth: Secondary | ICD-10-CM | POA: Diagnosis present

## 2023-08-05 DIAGNOSIS — O9902 Anemia complicating childbirth: Secondary | ICD-10-CM | POA: Diagnosis present

## 2023-08-05 DIAGNOSIS — O323XX0 Maternal care for face, brow and chin presentation, not applicable or unspecified: Secondary | ICD-10-CM | POA: Diagnosis present

## 2023-08-05 DIAGNOSIS — Z8249 Family history of ischemic heart disease and other diseases of the circulatory system: Secondary | ICD-10-CM | POA: Diagnosis not present

## 2023-08-05 DIAGNOSIS — Z87891 Personal history of nicotine dependence: Secondary | ICD-10-CM

## 2023-08-05 DIAGNOSIS — Z98891 History of uterine scar from previous surgery: Secondary | ICD-10-CM

## 2023-08-05 DIAGNOSIS — E039 Hypothyroidism, unspecified: Secondary | ICD-10-CM | POA: Diagnosis present

## 2023-08-05 DIAGNOSIS — O4292 Full-term premature rupture of membranes, unspecified as to length of time between rupture and onset of labor: Secondary | ICD-10-CM | POA: Diagnosis present

## 2023-08-05 DIAGNOSIS — Z3A40 40 weeks gestation of pregnancy: Secondary | ICD-10-CM

## 2023-08-05 DIAGNOSIS — O26893 Other specified pregnancy related conditions, third trimester: Secondary | ICD-10-CM | POA: Diagnosis present

## 2023-08-05 LAB — COMPREHENSIVE METABOLIC PANEL
ALT: <5 U/L (ref 0–44)
AST: 24 U/L (ref 15–41)
Albumin: 2.7 g/dL — ABNORMAL LOW (ref 3.5–5.0)
Alkaline Phosphatase: 120 U/L (ref 38–126)
Anion gap: 9 (ref 5–15)
BUN: 13 mg/dL (ref 6–20)
CO2: 20 mmol/L — ABNORMAL LOW (ref 22–32)
Calcium: 8.8 mg/dL — ABNORMAL LOW (ref 8.9–10.3)
Chloride: 111 mmol/L (ref 98–111)
Creatinine, Ser: 0.79 mg/dL (ref 0.44–1.00)
GFR, Estimated: >60 mL/min (ref >60)
Glucose, Bld: 88 mg/dL (ref 70–99)
Potassium: 4 mmol/L (ref 3.5–5.1)
Sodium: 140 mmol/L (ref 135–145)
Total Bilirubin: 0.6 mg/dL (ref <1.2)
Total Protein: 6.4 g/dL — ABNORMAL LOW (ref 6.5–8.1)

## 2023-08-05 LAB — POCT FERN TEST: POCT Fern Test: POSITIVE

## 2023-08-05 LAB — CBC
HCT: 32.5 % — ABNORMAL LOW (ref 36.0–46.0)
Hemoglobin: 10.6 g/dL — ABNORMAL LOW (ref 12.0–15.0)
MCH: 29.9 pg (ref 26.0–34.0)
MCHC: 32.6 g/dL (ref 30.0–36.0)
MCV: 91.8 fL (ref 80.0–100.0)
Platelets: 304 10*3/uL (ref 150–400)
RBC: 3.54 MIL/uL — ABNORMAL LOW (ref 3.87–5.11)
RDW: 13.2 % (ref 11.5–15.5)
WBC: 9.6 10*3/uL (ref 4.0–10.5)
nRBC: 0 % (ref 0.0–0.2)

## 2023-08-05 LAB — RPR: RPR Ser Ql: NONREACTIVE

## 2023-08-05 LAB — PROTEIN / CREATININE RATIO, URINE
Creatinine, Urine: 115 mg/dL
Protein Creatinine Ratio: 0.58 mg/mg{creat} — ABNORMAL HIGH (ref 0.00–0.15)
Total Protein, Urine: 67 mg/dL

## 2023-08-05 LAB — TYPE AND SCREEN
ABO/RH(D): A POS
Antibody Screen: NEGATIVE

## 2023-08-05 SURGERY — Surgical Case
Anesthesia: Epidural

## 2023-08-05 SURGERY — Surgical Case
Anesthesia: Regional

## 2023-08-05 MED ORDER — FENTANYL-BUPIVACAINE-NACL 0.5-0.125-0.9 MG/250ML-% EP SOLN
12.0000 mL/h | EPIDURAL | Status: DC | PRN
  Administered 2023-08-05: 12 mL/h via EPIDURAL
  Filled 2023-08-05: qty 250

## 2023-08-05 MED ORDER — TERBUTALINE SULFATE 1 MG/ML IJ SOLN: 0.2500 mg | Freq: Once | INTRAMUSCULAR | Status: DC | PRN

## 2023-08-05 MED ORDER — OXYTOCIN BOLUS FROM INFUSION: 333.0000 mL | Freq: Once | INTRAVENOUS | Status: DC

## 2023-08-05 MED ORDER — OXYCODONE-ACETAMINOPHEN 5-325 MG PO TABS: 1.0000 | ORAL_TABLET | ORAL | Status: DC | PRN

## 2023-08-05 MED ORDER — DEXMEDETOMIDINE HCL IN NACL 80 MCG/20ML IV SOLN
INTRAVENOUS | Status: DC | PRN
  Administered 2023-08-05 (×2): 8 ug via INTRAVENOUS
  Administered 2023-08-05: 4 ug via INTRAVENOUS

## 2023-08-05 MED ORDER — OXYTOCIN-SODIUM CHLORIDE 30-0.9 UT/500ML-% IV SOLN: 2.5000 [IU]/h | INTRAVENOUS | Status: DC

## 2023-08-05 MED ORDER — FLEET ENEMA RE ENEM: 1.0000 | ENEMA | RECTAL | Status: DC | PRN

## 2023-08-05 MED ORDER — CEFAZOLIN SODIUM-DEXTROSE 2-3 GM-%(50ML) IV SOLR
INTRAVENOUS | Status: DC | PRN
  Administered 2023-08-05: 2 g via INTRAVENOUS

## 2023-08-05 MED ORDER — SOD CITRATE-CITRIC ACID 500-334 MG/5ML PO SOLN: 30.0000 mL | ORAL | Status: DC | PRN

## 2023-08-05 MED ORDER — LACTATED RINGERS IV SOLN: INTRAVENOUS | Status: DC | PRN

## 2023-08-05 MED ORDER — LACTATED RINGERS IV SOLN: INTRAVENOUS | Status: DC

## 2023-08-05 MED ORDER — LACTATED RINGERS IV SOLN
500.0000 mL | INTRAVENOUS | Status: DC | PRN
  Administered 2023-08-05 (×2): 500 mL via INTRAVENOUS

## 2023-08-05 MED ORDER — DEXAMETHASONE SODIUM PHOSPHATE 10 MG/ML IJ SOLN
INTRAMUSCULAR | Status: DC | PRN
  Administered 2023-08-05: 10 mg via INTRAVENOUS

## 2023-08-05 MED ORDER — LIDOCAINE HCL (PF) 1 % IJ SOLN: 30.0000 mL | INTRAMUSCULAR | Status: DC | PRN

## 2023-08-05 MED ORDER — FENTANYL CITRATE (PF) 100 MCG/2ML IJ SOLN
INTRAMUSCULAR | Status: DC | PRN
  Administered 2023-08-05: 100 ug via EPIDURAL

## 2023-08-05 MED ORDER — PHENYLEPHRINE 80 MCG/ML (10ML) SYRINGE FOR IV PUSH (FOR BLOOD PRESSURE SUPPORT): 80.0000 ug | PREFILLED_SYRINGE | INTRAVENOUS | Status: DC | PRN

## 2023-08-05 MED ORDER — LIDOCAINE-EPINEPHRINE (PF) 2 %-1:200000 IJ SOLN
INTRAMUSCULAR | Status: DC | PRN
  Administered 2023-08-05 (×3): 5 mL via EPIDURAL

## 2023-08-05 MED ORDER — MORPHINE SULFATE (PF) 0.5 MG/ML IJ SOLN
INTRAMUSCULAR | Status: DC | PRN
  Administered 2023-08-05: 3 mg via EPIDURAL

## 2023-08-05 MED ORDER — EPHEDRINE 5 MG/ML INJ
10.0000 mg | INTRAVENOUS | Status: DC | PRN
  Filled 2023-08-05: qty 5

## 2023-08-05 MED ORDER — MORPHINE SULFATE (PF) 0.5 MG/ML IJ SOLN
INTRAMUSCULAR | Status: AC
  Filled 2023-08-05: qty 10

## 2023-08-05 MED ORDER — KETOROLAC TROMETHAMINE 30 MG/ML IJ SOLN
30.0000 mg | Freq: Once | INTRAMUSCULAR | Status: AC
  Administered 2023-08-05: 30 mg via INTRAVENOUS

## 2023-08-05 MED ORDER — MIDAZOLAM HCL 2 MG/2ML IJ SOLN
INTRAMUSCULAR | Status: DC | PRN
  Administered 2023-08-05: 2 mg via INTRAVENOUS

## 2023-08-05 MED ORDER — FENTANYL CITRATE (PF) 100 MCG/2ML IJ SOLN
INTRAMUSCULAR | Status: AC
  Filled 2023-08-05: qty 2

## 2023-08-05 MED ORDER — ONDANSETRON HCL 4 MG/2ML IJ SOLN
4.0000 mg | Freq: Four times a day (QID) | INTRAMUSCULAR | Status: DC | PRN
  Administered 2023-08-05: 4 mg via INTRAVENOUS
  Filled 2023-08-05: qty 2

## 2023-08-05 MED ORDER — DIPHENHYDRAMINE HCL 50 MG/ML IJ SOLN: 12.5000 mg | INTRAMUSCULAR | Status: DC | PRN

## 2023-08-05 MED ORDER — FENTANYL CITRATE (PF) 100 MCG/2ML IJ SOLN
100.0000 ug | INTRAMUSCULAR | Status: DC | PRN
  Administered 2023-08-05: 100 ug via INTRAVENOUS
  Filled 2023-08-05: qty 2

## 2023-08-05 MED ORDER — BUPIVACAINE HCL (PF) 0.25 % IJ SOLN
INTRAMUSCULAR | Status: AC
  Filled 2023-08-05: qty 30

## 2023-08-05 MED ORDER — KETAMINE HCL 10 MG/ML IJ SOLN
INTRAMUSCULAR | Status: DC | PRN
  Administered 2023-08-05: 20 mg via INTRAVENOUS

## 2023-08-05 MED ORDER — MISOPROSTOL 50MCG HALF TABLET
50.0000 ug | ORAL_TABLET | ORAL | Status: DC | PRN
  Administered 2023-08-05 (×2): 50 ug via ORAL
  Filled 2023-08-05 (×2): qty 1

## 2023-08-05 MED ORDER — OXYTOCIN-SODIUM CHLORIDE 30-0.9 UT/500ML-% IV SOLN
1.0000 m[IU]/min | INTRAVENOUS | Status: DC
  Administered 2023-08-05 (×2): 2 m[IU]/min via INTRAVENOUS
  Filled 2023-08-05: qty 500

## 2023-08-05 MED ORDER — MIDAZOLAM HCL 2 MG/2ML IJ SOLN
INTRAMUSCULAR | Status: AC
  Filled 2023-08-05: qty 2

## 2023-08-05 MED ORDER — SODIUM CHLORIDE 0.9 % IV SOLN
INTRAVENOUS | Status: DC | PRN
  Administered 2023-08-05: 500 mg via INTRAVENOUS

## 2023-08-05 MED ORDER — OXYTOCIN-SODIUM CHLORIDE 30-0.9 UT/500ML-% IV SOLN
INTRAVENOUS | Status: DC | PRN
  Administered 2023-08-05: 30 [IU] via INTRAVENOUS

## 2023-08-05 MED ORDER — OXYCODONE-ACETAMINOPHEN 5-325 MG PO TABS: 2.0000 | ORAL_TABLET | ORAL | Status: DC | PRN

## 2023-08-05 MED ORDER — ACETAMINOPHEN 10 MG/ML IV SOLN
INTRAVENOUS | Status: AC
  Filled 2023-08-05: qty 100

## 2023-08-05 MED ORDER — DROPERIDOL 2.5 MG/ML IJ SOLN: 0.6250 mg | Freq: Once | INTRAMUSCULAR | Status: DC | PRN

## 2023-08-05 MED ORDER — PHENYLEPHRINE 80 MCG/ML (10ML) SYRINGE FOR IV PUSH (FOR BLOOD PRESSURE SUPPORT)
80.0000 ug | PREFILLED_SYRINGE | INTRAVENOUS | Status: DC | PRN
Start: 2023-08-05 — End: 2023-08-06
  Filled 2023-08-05: qty 10

## 2023-08-05 MED ORDER — EPHEDRINE 5 MG/ML INJ: 10.0000 mg | INTRAVENOUS | Status: DC | PRN

## 2023-08-05 MED ORDER — OXYTOCIN-SODIUM CHLORIDE 30-0.9 UT/500ML-% IV SOLN
INTRAVENOUS | Status: AC
  Filled 2023-08-05: qty 500

## 2023-08-05 MED ORDER — KETAMINE HCL 50 MG/5ML IJ SOSY
PREFILLED_SYRINGE | INTRAMUSCULAR | Status: AC
  Filled 2023-08-05: qty 5

## 2023-08-05 MED ORDER — ACETAMINOPHEN 10 MG/ML IV SOLN
INTRAVENOUS | Status: DC | PRN
  Administered 2023-08-05: 1000 mg via INTRAVENOUS

## 2023-08-05 MED ORDER — FENTANYL CITRATE (PF) 100 MCG/2ML IJ SOLN
25.0000 ug | INTRAMUSCULAR | Status: DC | PRN
  Administered 2023-08-05: 50 ug via INTRAVENOUS
  Administered 2023-08-05 (×2): 25 ug via INTRAVENOUS

## 2023-08-05 MED ORDER — AZITHROMYCIN 500 MG IV SOLR
INTRAVENOUS | Status: AC
  Filled 2023-08-05: qty 5

## 2023-08-05 MED ORDER — KETOROLAC TROMETHAMINE 30 MG/ML IJ SOLN
INTRAMUSCULAR | Status: AC
  Filled 2023-08-05: qty 1

## 2023-08-05 MED ORDER — LIDOCAINE HCL (PF) 1 % IJ SOLN
INTRAMUSCULAR | Status: DC | PRN
  Administered 2023-08-05 (×2): 4 mL via EPIDURAL

## 2023-08-05 MED ORDER — LACTATED RINGERS IV SOLN: 500.0000 mL | Freq: Once | INTRAVENOUS | Status: DC

## 2023-08-05 MED ORDER — ACETAMINOPHEN 325 MG PO TABS: 650.0000 mg | ORAL_TABLET | ORAL | Status: DC | PRN

## 2023-08-05 SURGICAL SUPPLY — 31 items
BARRIER ADHS 3X4 INTERCEED (GAUZE/BANDAGES/DRESSINGS) IMPLANT
BENZOIN TINCTURE PRP APPL 2/3 (GAUZE/BANDAGES/DRESSINGS) IMPLANT
CHLORAPREP W/TINT 26 (MISCELLANEOUS) ×2 IMPLANT
CLAMP UMBILICAL CORD (MISCELLANEOUS) ×1 IMPLANT
CLOTH BEACON ORANGE TIMEOUT ST (SAFETY) ×1 IMPLANT
DRSG OPSITE POSTOP 4X10 (GAUZE/BANDAGES/DRESSINGS) ×1 IMPLANT
ELECT REM PT RETURN 9FT ADLT (ELECTROSURGICAL) ×1
ELECTRODE REM PT RTRN 9FT ADLT (ELECTROSURGICAL) ×1 IMPLANT
EXTRACTOR VACUUM M CUP 4 TUBE (SUCTIONS) IMPLANT
GLOVE BIO SURGEON STRL SZ 6.5 (GLOVE) ×1 IMPLANT
GLOVE BIOGEL PI IND STRL 7.0 (GLOVE) ×1 IMPLANT
GOWN STRL REUS W/TWL LRG LVL3 (GOWN DISPOSABLE) ×2 IMPLANT
KIT ABG SYR 3ML LUER SLIP (SYRINGE) IMPLANT
MAT PREVALON FULL STRYKER (MISCELLANEOUS) IMPLANT
NDL HYPO 25X5/8 SAFETYGLIDE (NEEDLE) ×1 IMPLANT
NEEDLE HYPO 22GX1.5 SAFETY (NEEDLE) IMPLANT
NEEDLE HYPO 25X5/8 SAFETYGLIDE (NEEDLE) ×1 IMPLANT
NS IRRIG 1000ML POUR BTL (IV SOLUTION) ×1 IMPLANT
PACK C SECTION WH (CUSTOM PROCEDURE TRAY) ×1 IMPLANT
PAD OB MATERNITY 4.3X12.25 (PERSONAL CARE ITEMS) ×1 IMPLANT
STRIP CLOSURE SKIN 1/2X4 (GAUZE/BANDAGES/DRESSINGS) IMPLANT
SUT CHROMIC 0 CTX 36 (SUTURE) ×2 IMPLANT
SUT PLAIN 0 NONE (SUTURE) IMPLANT
SUT PLAIN 2 0 XLH (SUTURE) IMPLANT
SUT VIC AB 0 CT1 27XBRD ANBCTR (SUTURE) ×3 IMPLANT
SUT VIC AB 4-0 KS 27 (SUTURE) IMPLANT
SYR CONTROL 10ML LL (SYRINGE) IMPLANT
TOWEL OR 17X24 6PK STRL BLUE (TOWEL DISPOSABLE) ×1 IMPLANT
TRAY FOLEY W/BAG SLVR 14FR LF (SET/KITS/TRAYS/PACK) ×1 IMPLANT
VACUUM CUP M-STYLE MYSTIC II (SUCTIONS) IMPLANT
WATER STERILE IRR 1000ML POUR (IV SOLUTION) ×1 IMPLANT

## 2023-08-05 NOTE — Anesthesia Procedure Notes (Signed)
Epidural Patient location during procedure: OB Start time: 08/05/2023 5:02 PM End time: 08/05/2023 5:05 PM  Staffing Anesthesiologist: Kaylyn Layer, MD Performed: anesthesiologist   Preanesthetic Checklist Completed: patient identified, IV checked, risks and benefits discussed, monitors and equipment checked, pre-op evaluation and timeout performed  Epidural Patient position: sitting Prep: DuraPrep and site prepped and draped Patient monitoring: continuous pulse ox, blood pressure and heart rate Approach: midline Location: L3-L4 Injection technique: LOR air  Needle:  Needle type: Tuohy  Needle gauge: 17 G Needle length: 9 cm Needle insertion depth: 5 cm Catheter type: closed end flexible Catheter size: 19 Gauge Catheter at skin depth: 10 cm Test dose: negative and Other (1% lidocaine)  Assessment Events: blood not aspirated, no cerebrospinal fluid, injection not painful, no injection resistance, no paresthesia and negative IV test  Additional Notes Patient identified. Risks, benefits, and alternatives discussed with patient including but not limited to bleeding, infection, nerve damage, paralysis, failed block, incomplete pain control, headache, blood pressure changes, nausea, vomiting, reactions to medication, itching, and postpartum back pain. Confirmed with bedside nurse the patient's most recent platelet count. Confirmed with patient that they are not currently taking any anticoagulation, have any bleeding history, or any family history of bleeding disorders. Patient expressed understanding and wished to proceed. All questions were answered. Sterile technique was used throughout the entire procedure. Please see nursing notes for vital signs.   Crisp LOR on first pass. Test dose was given through epidural catheter and negative prior to continuing to dose epidural or start infusion. Warning signs of high block given to the patient including shortness of breath,  tingling/numbness in hands, complete motor block, or any concerning symptoms with instructions to call for help. Patient was given instructions on fall risk and not to get out of bed. All questions and concerns addressed with instructions to call with any issues or inadequate analgesia.  Reason for block:procedure for pain

## 2023-08-05 NOTE — Anesthesia Postprocedure Evaluation (Signed)
Anesthesia Post Note  Patient: Victoria Foster  Procedure(s) Performed: CESAREAN SECTION     Patient location during evaluation: PACU Anesthesia Type: Epidural Level of consciousness: awake and alert Pain management: pain level controlled Vital Signs Assessment: post-procedure vital signs reviewed and stable Respiratory status: spontaneous breathing, nonlabored ventilation and respiratory function stable Cardiovascular status: blood pressure returned to baseline Postop Assessment: epidural receding, no apparent nausea or vomiting, no headache and no backache Anesthetic complications: no   No notable events documented.  Last Vitals:  Vitals:   08/05/23 2300 08/05/23 2315  BP: 125/72 124/81  Pulse: 85 81  Resp: (!) 26 18  Temp:    SpO2: 96% 95%    Last Pain:  Vitals:   08/05/23 2315  TempSrc:   PainSc: 10-Worst pain ever   Pain Goal: Patients Stated Pain Goal: 0 (08/05/23 1730)  LLE Motor Response: Purposeful movement (08/05/23 2315) LLE Sensation: Increased (08/05/23 2315) RLE Motor Response: Purposeful movement (08/05/23 2315) RLE Sensation: Increased (08/05/23 2315)     Epidural/Spinal Function Cutaneous sensation: Able to Wiggle Toes (08/05/23 2315), Patient able to flex knees: Yes (08/05/23 2315), Patient able to lift hips off bed: No (08/05/23 2315), Back pain beyond tenderness at insertion site: No (08/05/23 2315), Progressively worsening motor and/or sensory loss: No (08/05/23 2315), Bowel and/or bladder incontinence post epidural: No (08/05/23 2315)  Shanda Howells

## 2023-08-05 NOTE — Progress Notes (Signed)
Pt declining fundal massages x2. Pt educated on increased risk of bleeding. Pt agreeable to let RN assess lochia on pad. Pt able to flex knees independently. Scant bleeding noted on pad. Abdomen appears soft - no noticeable change visually.

## 2023-08-05 NOTE — Progress Notes (Signed)
Called by nurse for repetitive late decels and strip is a little flat  Cervix no changed now I can feel some thick scar tissue at the cervical os I do not think vaginal delivery is feasible - she is remote from vaginal delivery and tracing is Category 2 Recommend C Section Or notified  They will be ready in 20 minutes  Consented

## 2023-08-05 NOTE — Progress Notes (Signed)
At bedside with pt. Pt anxious and panicking. C/o of numbness in left arm, pt able to squeeze this RN's hand with left hand. Pt also crying and stating she wants to go home. Emotional support provided. Pt became calm and transferred to L&D.

## 2023-08-05 NOTE — Anesthesia Preprocedure Evaluation (Signed)
Anesthesia Evaluation  Patient identified by MRN, date of birth, ID band Patient awake    Reviewed: Allergy & Precautions, Patient's Chart, lab work & pertinent test results  History of Anesthesia Complications Negative for: history of anesthetic complications  Airway Mallampati: II  TM Distance: >3 FB Neck ROM: Full    Dental no notable dental hx.    Pulmonary former smoker   Pulmonary exam normal        Cardiovascular negative cardio ROS Normal cardiovascular exam     Neuro/Psych negative neurological ROS     GI/Hepatic negative GI ROS, Neg liver ROS,,,  Endo/Other  Hypothyroidism    Renal/GU negative Renal ROS  negative genitourinary   Musculoskeletal negative musculoskeletal ROS (+)    Abdominal   Peds  Hematology  (+) Blood dyscrasia, anemia   Anesthesia Other Findings Day of surgery medications reviewed with patient.  Reproductive/Obstetrics (+) Pregnancy                             Anesthesia Physical Anesthesia Plan  ASA: 2  Anesthesia Plan: Epidural   Post-op Pain Management:    Induction:   PONV Risk Score and Plan: Treatment may vary due to age or medical condition  Airway Management Planned: Natural Airway  Additional Equipment: Fetal Monitoring  Intra-op Plan:   Post-operative Plan:   Informed Consent: I have reviewed the patients History and Physical, chart, labs and discussed the procedure including the risks, benefits and alternatives for the proposed anesthesia with the patient or authorized representative who has indicated his/her understanding and acceptance.       Plan Discussed with:   Anesthesia Plan Comments:        Anesthesia Quick Evaluation

## 2023-08-05 NOTE — MAU Note (Signed)

## 2023-08-05 NOTE — Transfer of Care (Signed)
Immediate Anesthesia Transfer of Care Note  Patient: Issa Digeronimo  Procedure(s) Performed: CESAREAN SECTION  Patient Location: PACU  Anesthesia Type:Epidural  Level of Consciousness: awake, alert , oriented, and patient cooperative  Airway & Oxygen Therapy: Patient Spontanous Breathing  Post-op Assessment: Report given to RN, Post -op Vital signs reviewed and stable, and Patient moving all extremities X 4  Post vital signs: Reviewed and stable  Last Vitals:  Vitals Value Taken Time  BP 107/74 08/05/23 2246  Temp 36.4 C 08/05/23 2246  Pulse 79 08/05/23 2255  Resp 19 08/05/23 2255  SpO2 96 % 08/05/23 2255  Vitals shown include unfiled device data.  Last Pain:  Vitals:   08/05/23 2246  TempSrc: Oral  PainSc: 10-Worst pain ever      Patients Stated Pain Goal: 0 (08/05/23 1730)  Complications: No notable events documented.

## 2023-08-05 NOTE — H&P (Signed)
Victoria Foster is a 37 y.o. G 11 P 0090 at 40 weeks presents with SROM - meconium stained fluid.   Planning on labor epidural. GBBS is negative.  OB History     Gravida  11   Para      Term      Preterm      AB  9   Living         SAB  8   IAB      Ectopic  1   Multiple      Live Births             Past Medical History:  Diagnosis Date  . ADHD (attention deficit hyperactivity disorder)   . Hypothyroid   . Medical history non-contributory    Past Surgical History:  Procedure Laterality Date  . KNEE ARTHROSCOPY Left   . KNEE SURGERY     Family History: family history includes Heart disease in her father; Hyperlipidemia in her mother; Hypertension in her mother; Thyroid disease in her mother. Social History:  reports that she has quit smoking. She has a 0.1 pack-year smoking history. She has never used smokeless tobacco. She reports current alcohol use of about 3.0 standard drinks of alcohol per week. She reports that she does not currently use drugs after having used the following drugs: Marijuana.     Maternal Diabetes: No Genetic Screening: Normal Maternal Ultrasounds/Referrals: Normal Fetal Ultrasounds or other Referrals:  None Maternal Substance Abuse:  No Significant Maternal Medications:  None Significant Maternal Lab Results:  None Number of Prenatal Visits:greater than 3 verified prenatal visits Maternal Vaccinations:TDap and Flu Other Comments:  None  Review of Systems Maternal Medical History:  Reason for admission: Rupture of membranes.   Dilation: Closed Station: Ballotable Exam by:: Blue, RN Blood pressure 136/86, pulse 67, temperature 98.3 F (36.8 C), temperature source Oral, resp. rate 18, height 5' (1.524 m), weight 81.9 kg, SpO2 100%, unknown if currently breastfeeding. Maternal Exam:  Uterine Assessment: Contraction strength is mild.  Contraction frequency is irregular.  Abdomen: Fetal presentation: vertex   Fetal  Exam Fetal State Assessment: Category I - tracings are normal. Physical Exam Vitals and nursing note reviewed. Exam conducted with a chaperone present.  Constitutional:      Appearance: Normal appearance.  HENT:     Head: Normocephalic.     Right Ear: Tympanic membrane normal.     Nose: Nose normal.     Mouth/Throat:     Mouth: Mucous membranes are moist.  Cardiovascular:     Rate and Rhythm: Normal rate and regular rhythm.  Neurological:     Mental Status: She is alert.    Prenatal labs: ABO, Rh: --/--/A POS (12/14 0308) Antibody: NEG (12/14 0308) Rubella: Immune (05/16 0000) RPR: Nonreactive (09/22 0000)  HBsAg: Negative (05/16 0000)  HIV: Non-reactive (09/22 0000)  GBS: Negative/-- (11/24 0000)   Assessment/Plan: IUP at term SROM Cervix is unfavorable - start buccal cytotec, planning on epidural  Pitocin discussed    Victoria Foster 08/05/2023, 7:33 AM

## 2023-08-05 NOTE — Brief Op Note (Signed)
08/05/2023  10:26 PM  PATIENT:  Victoria Foster  37 y.o. female  PRE-OPERATIVE DIAGNOSIS:   IUP at 40 weeks Prolonged SROM Failure to progress  Repetitive late decelerations  POST-OPERATIVE DIAGNOSIS:   Same Face presentation  PROCEDURE:  Procedure(s): CESAREAN SECTION (N/A)  SURGEON:  Surgeons and Role:    Marcelle Overlie, MD - Primary  PHYSICIAN ASSISTANT:   ASSISTANTS: none   ANESTHESIA:   epidural  EBL:  per anesthesia record    BLOOD ADMINISTERED:none  DRAINS: Urinary Catheter (Foley)   LOCAL MEDICATIONS USED:  NONE  SPECIMEN:  No Specimen  DISPOSITION OF SPECIMEN:  N/A  COUNTS:  YES  TOURNIQUET:  * No tourniquets in log *  DICTATION: .Other Dictation: Dictation Number dictated  PLAN OF CARE: Admit to inpatient   PATIENT DISPOSITION:  PACU - hemodynamically stable.   Delay start of Pharmacological VTE agent (>24hrs) due to surgical blood loss or risk of bleeding: not applicable

## 2023-08-05 NOTE — MAU Note (Signed)
.  Victoria Foster is a 37 y.o. at [redacted]w[redacted]d here in MAU reporting: water broke around 0115 - clear fluid and still continuing to have gushes. Reports losing mucous plug. Denies VB or pain. +FM. Denies problems with BP in the pregnancy - no PIH symptoms.   Onset of complaint: 0115 Pain score: 0 Vitals:   08/05/23 0222  BP: (!) 154/88  Pulse: 85  Resp: 16  Temp: 98.4 F (36.9 C)  SpO2: 97%     FHT:137 Lab orders placed from triage:  fern

## 2023-08-06 DIAGNOSIS — Z98891 History of uterine scar from previous surgery: Secondary | ICD-10-CM

## 2023-08-06 LAB — CBC
HCT: 30.2 % — ABNORMAL LOW (ref 36.0–46.0)
Hemoglobin: 10.1 g/dL — ABNORMAL LOW (ref 12.0–15.0)
MCH: 30.1 pg (ref 26.0–34.0)
MCHC: 33.4 g/dL (ref 30.0–36.0)
MCV: 90.1 fL (ref 80.0–100.0)
Platelets: 253 10*3/uL (ref 150–400)
RBC: 3.35 MIL/uL — ABNORMAL LOW (ref 3.87–5.11)
RDW: 13.2 % (ref 11.5–15.5)
WBC: 16.9 10*3/uL — ABNORMAL HIGH (ref 4.0–10.5)
nRBC: 0 % (ref 0.0–0.2)

## 2023-08-06 MED ORDER — COCONUT OIL OIL: 1.0000 | TOPICAL_OIL | Status: DC | PRN

## 2023-08-06 MED ORDER — WITCH HAZEL-GLYCERIN EX PADS: 1.0000 | MEDICATED_PAD | CUTANEOUS | Status: DC | PRN

## 2023-08-06 MED ORDER — NALOXONE HCL 0.4 MG/ML IJ SOLN: 0.4000 mg | INTRAMUSCULAR | Status: DC | PRN

## 2023-08-06 MED ORDER — SIMETHICONE 80 MG PO CHEW: 80.0000 mg | CHEWABLE_TABLET | ORAL | Status: DC | PRN

## 2023-08-06 MED ORDER — OXYTOCIN-SODIUM CHLORIDE 30-0.9 UT/500ML-% IV SOLN
2.5000 [IU]/h | INTRAVENOUS | Status: AC
Start: 2023-08-06 — End: 2023-08-06

## 2023-08-06 MED ORDER — MENTHOL 3 MG MT LOZG: 1.0000 | LOZENGE | OROMUCOSAL | Status: DC | PRN

## 2023-08-06 MED ORDER — IBUPROFEN 600 MG PO TABS
600.0000 mg | ORAL_TABLET | Freq: Four times a day (QID) | ORAL | Status: DC
  Administered 2023-08-06 – 2023-08-08 (×8): 600 mg via ORAL
  Filled 2023-08-06 (×8): qty 1

## 2023-08-06 MED ORDER — DIPHENHYDRAMINE HCL 25 MG PO CAPS: 25.0000 mg | ORAL_CAPSULE | ORAL | Status: DC | PRN

## 2023-08-06 MED ORDER — NALOXONE HCL 4 MG/10ML IJ SOLN: 1.0000 ug/kg/h | INTRAVENOUS | Status: DC | PRN

## 2023-08-06 MED ORDER — SENNOSIDES-DOCUSATE SODIUM 8.6-50 MG PO TABS
2.0000 | ORAL_TABLET | Freq: Every day | ORAL | Status: DC
  Administered 2023-08-07 – 2023-08-08 (×2): 2 via ORAL
  Filled 2023-08-06 (×2): qty 2

## 2023-08-06 MED ORDER — PRENATAL MULTIVITAMIN CH
1.0000 | ORAL_TABLET | Freq: Every day | ORAL | Status: DC
  Administered 2023-08-07 – 2023-08-08 (×2): 1 via ORAL
  Filled 2023-08-06 (×2): qty 1

## 2023-08-06 MED ORDER — LACTATED RINGERS IV SOLN: INTRAVENOUS | Status: AC

## 2023-08-06 MED ORDER — KETOROLAC TROMETHAMINE 30 MG/ML IJ SOLN: 30.0000 mg | Freq: Four times a day (QID) | INTRAMUSCULAR | Status: AC | PRN

## 2023-08-06 MED ORDER — DIBUCAINE (PERIANAL) 1 % EX OINT: 1.0000 | TOPICAL_OINTMENT | CUTANEOUS | Status: DC | PRN

## 2023-08-06 MED ORDER — SODIUM CHLORIDE 0.9% FLUSH: 3.0000 mL | INTRAVENOUS | Status: DC | PRN

## 2023-08-06 MED ORDER — OXYCODONE HCL 5 MG PO TABS
5.0000 mg | ORAL_TABLET | ORAL | Status: DC | PRN
  Administered 2023-08-07 – 2023-08-08 (×7): 10 mg via ORAL
  Filled 2023-08-06 (×7): qty 2

## 2023-08-06 MED ORDER — KETOROLAC TROMETHAMINE 30 MG/ML IJ SOLN
30.0000 mg | Freq: Four times a day (QID) | INTRAMUSCULAR | Status: AC | PRN
  Administered 2023-08-06 (×2): 30 mg via INTRAVENOUS
  Filled 2023-08-06 (×3): qty 1

## 2023-08-06 MED ORDER — ACETAMINOPHEN 325 MG PO TABS
650.0000 mg | ORAL_TABLET | ORAL | Status: DC | PRN
  Administered 2023-08-07 – 2023-08-08 (×5): 650 mg via ORAL
  Filled 2023-08-06 (×5): qty 2

## 2023-08-06 MED ORDER — DIPHENHYDRAMINE HCL 25 MG PO CAPS: 25.0000 mg | ORAL_CAPSULE | Freq: Four times a day (QID) | ORAL | Status: DC | PRN

## 2023-08-06 MED ORDER — ONDANSETRON HCL 4 MG/2ML IJ SOLN
4.0000 mg | Freq: Three times a day (TID) | INTRAMUSCULAR | Status: DC | PRN
  Administered 2023-08-06: 4 mg via INTRAVENOUS
  Filled 2023-08-06: qty 2

## 2023-08-06 MED ORDER — ZOLPIDEM TARTRATE 5 MG PO TABS: 5.0000 mg | ORAL_TABLET | Freq: Every evening | ORAL | Status: DC | PRN

## 2023-08-06 MED ORDER — DIPHENHYDRAMINE HCL 50 MG/ML IJ SOLN: 12.5000 mg | INTRAMUSCULAR | Status: DC | PRN

## 2023-08-06 MED ORDER — ACETAMINOPHEN 500 MG PO TABS
1000.0000 mg | ORAL_TABLET | Freq: Four times a day (QID) | ORAL | Status: AC
  Administered 2023-08-06 (×4): 1000 mg via ORAL
  Filled 2023-08-06 (×4): qty 2

## 2023-08-06 MED ORDER — SIMETHICONE 80 MG PO CHEW
80.0000 mg | CHEWABLE_TABLET | Freq: Three times a day (TID) | ORAL | Status: DC
  Administered 2023-08-06 – 2023-08-08 (×6): 80 mg via ORAL
  Filled 2023-08-06 (×6): qty 1

## 2023-08-06 NOTE — Lactation Note (Signed)
This note was copied from a baby's chart. Lactation Consultation Note  Patient Name: Victoria Foster [id number] Date: 08/06/2023 Age:37 hours Reason for consult: Initial assessment;1st time breastfeeding;Term;Maternal endocrine disorder, C/S delivery.  [name] latched infant on her left breast using the football hold position, infant sustained latch and was still BF after 10 minutes when LC left the room. [name] will continue to BF infant by cues, on demand, every 2-3 hours, skin to skin. LC discussed infant's I&O, LC discussed importance of maternal rest, balance diet and hydration. [name] knows to call for further latch assistance if needed. [name] was  made aware of O/P services, breastfeeding support groups, community resources, and our phone # for post-discharge questions.    Maternal Data Has patient been taught Hand Expression?: Yes Does the patient have breastfeeding experience prior to this delivery?: No  Feeding Mother's Current Feeding Choice: Breast Milk  LATCH Score Latch: Grasps breast easily, tongue down, lips flanged, rhythmical sucking.  Audible Swallowing: Spontaneous and intermittent  Type of Nipple: Inverted (responds well to stimulate does not need NS)  Comfort (Breast/Nipple): Soft / non-tender  Hold (Positioning): Assistance needed to correctly position infant at breast and maintain latch.  LATCH Score: 7   Lactation Tools Discussed/Used    Interventions Interventions: Breast feeding basics reviewed;Assisted with latch;Skin to skin;Breast compression;Adjust position;Support pillows;Position options;Education;LC Services brochure  Discharge Pump: DEBP;Personal (Medela DEBP at home.)  Consult Status Consult Status: Follow-up Date: 08/06/23 Follow-up type: In-patient    Frederico Hamman 08/06/2023, 1:55 AM

## 2023-08-06 NOTE — Progress Notes (Signed)
Patient is doing very well BP 119/74 (BP Location: Left Arm)   Pulse 64   Temp 98 F (36.7 C) (Oral)   Resp 18   Ht 5' (1.524 m)   Wt 81.9 kg   SpO2 98%   Breastfeeding Unknown   BMI 35.25 kg/m  Results for orders placed or performed during the hospital encounter of 08/05/23 (from the past 24 hours)  CBC     Status: Abnormal   Collection Time: 08/06/23  4:52 AM  Result Value Ref Range   WBC 16.9 (H) 4.0 - 10.5 K/uL   RBC 3.35 (L) 3.87 - 5.11 MIL/uL   Hemoglobin 10.1 (L) 12.0 - 15.0 g/dL   HCT 66.0 (L) 63.0 - 16.0 %   MCV 90.1 80.0 - 100.0 fL   MCH 30.1 26.0 - 34.0 pg   MCHC 33.4 30.0 - 36.0 g/dL   RDW 10.9 32.3 - 55.7 %   Platelets 253 150 - 400 K/uL   nRBC 0.0 0.0 - 0.2 %   Abdomen dressing is dry   POD # 1  Doing very well Routine care

## 2023-08-06 NOTE — Progress Notes (Signed)
Patient still vomiting off and on. Patient had not vomited since about 0430 so she attempted to eat broth and then ate pineapple and vomited again. Patient has just had zofran at 0600; however, previous RN stopped IV fluids. The only order is for IV pitocin. Bleeding is small. Urine adequate but darker yellow. Called Dr. Vincente Poli requesting an order for LR to run until patient able to tolerate a regular diet. Dr. Vincente Poli ordered LR at 125 mL per hour until tolerating diet. Earl Gala, Linda Hedges Bowring

## 2023-08-06 NOTE — Lactation Note (Signed)
This note was copied from a baby's chart. Lactation Consultation Note  Patient Name: Victoria Foster [id number] Date: 08/06/2023 Age:37 hours Reason for consult: Follow-up assessment;Mother's request;1st time breastfeeding;Maternal endocrine disorder;Term  P1, Mother requested assistance due to concern over not having enough colostrum.  Baby was latched when [facility] entered room on R breast.  Suggest mother hand express from L breast and mother was happy to view drops of colostrum with hand expression.  Reassured mother. Provided education and suggest mother call for help as needed.    Maternal Data Has patient been taught Hand Expression?: Yes  Feeding Mother's Current Feeding Choice: Breast Milk  LATCH Score Latch: Grasps breast easily, tongue down, lips flanged, rhythmical sucking.  Audible Swallowing: A few with stimulation  Type of Nipple: Everted at rest and after stimulation  Comfort (Breast/Nipple): Soft / non-tender  Hold (Positioning): Assistance needed to correctly position infant at breast and maintain latch.  LATCH Score: 8  Interventions Interventions: Breast feeding basics reviewed;Hand express;Education  Consult Status  Follow up    Hardie Pulley  RN, IBCLC 08/06/2023, 9:28 AM

## 2023-08-06 NOTE — Op Note (Signed)
Victoria Foster, Victoria Foster MEDICAL RECORD NO: [id number] ACCOUNT NO: [id number] DATE OF BIRTH: 08-11-1986 FACILITY: [facility] LOCATION: [facility] PHYSICIAN: Marcelino Duster L. Vincente Poli, MD  Operative Report   DATE OF PROCEDURE: 08/05/2023  PREOPERATIVE DIAGNOSES: 1. Intrauterine pregnancy at 40 weeks. 2. Prolonged rupture of membranes. 3. Failure to progress. 4. Not reassuring fetal heart rate.  POSTOPERATIVE DIAGNOSES: 1. Intrauterine pregnancy at 40 weeks. 2. Prolonged rupture of membranes. 3. Failure to progress. 4. Not reassuring fetal heart rate. 5. Face presentation.  PROCEDURE: Primary low transverse cesarean section.  SURGEON: Isaiha Asare L. Vincente Poli, MD  ANESTHESIA: Epidural.  ESTIMATED BLOOD LOSS: Per anesthesia records.  DRAINS: Foley catheter.  DESCRIPTION OF PROCEDURE: The patient was taken to the operating room. She was then dosed with her epidural and then tested with Allis. She was found to be numb. She was prepped and draped in the usual sterile fashion. A timeout was performed. A large  transverse incision was made and carried down to the fascia. The fascia was scored in the midline and extended laterally. The rectus muscles were separated from the overlying fascia. The peritoneum was entered high with a hemostat. The peritoneal  incision was then stretched. We made a low transverse incision in the uterus and entered using a hemostat. Meconium was noted. The baby was in face presentation and the head was hyperextended. I was able to rotate the head, flex the head, and then  deliver it easily. The baby was a female infant. Apgars 8 at 1 minute and 9 at 5 minutes.  The cord was clamped and cut. The baby was handed to the waiting neonatal team. Cord blood was obtained.  The uterus was exteriorized and cleared of all clots and  debris. The uterine incision was closed in one layer using #0 chromic in a running lock stitch. Irrigation was performed after hemostasis was noted. The peritoneum  was closed using #0 Vicryl. The fascia was closed using #0 Vicryl in a running stitch. The  skin was closed with a subcuticular 3-0 Vicryl on a [employer]. Benzoin, Steri-Strips, and honeycomb dressing were applied. The patient tolerated the procedure well. All sponge, lap, and instrument counts were correct x2. The patient went to the  recovery room in stable condition.   SHY D: 08/05/2023 10:32:13 pm T: 08/06/2023 10:44:00 am  JOB: [id number]/ [id number]

## 2023-08-06 NOTE — Progress Notes (Signed)
CSW received consult for hx of Anxiety and Depression. CSW met with [name] to offer support and complete assessment. When CSW entered room, FOB was present. [name] was observed sitting in hospital bed holding infant smiling. FOB was sitting nearby. [name] provided verbal consent to speak in front of FOB about anything. CSW introduced self and reason for consult. [name] was agreeable to consult,  presented as calm and remained engaged.   CSW inquired how [name] has felt emotionally since infant's arrival. [name] shared she is feeling great. CSW inquired about [name]'s mental health history. [name] reports a history of significant depression and anxiety symptoms in her mid to late twenties, sharing that she was prescribed antidepressants and attended therapy at the time. [name] shares that since her late twenties, she has endorsed some anxiety but her symptoms have been manageable. During her pregnancy, [name] reports endorsing feelings of generalized worry and stress about making decisions but feels that she was able to recognize when she was having anxious thoughts and symptoms were not significant enough for her to feel she needed additional support. [name] declined additional outpatient mental health resources at this time, sharing that she feels comfortable talking to her OBGYN if mental health symptoms arise postpartum. [name] shared she has "great support", marked by FOB, friends, family, and coworkers. [name] denied current SI/HI. DV was not assessed due to FOB being present.   CSW provided education regarding the baby blues period vs. perinatal mood disorders, discussed treatment and gave resources for mental health follow up if concerns arise.  CSW recommends self-evaluation during the postpartum time period using the New Mom Checklist from Postpartum Progress and encouraged [name] to contact a medical professional if symptoms are noted at any time.    [name] reports she has all needed items for infant, including a car seat and bassinet. [name] has  chosen [facility] for infant's follow up care.  CSW provided review of Sudden Infant Death Syndrome (SIDS) precautions.    CSW identifies no further need for intervention and no barriers to discharge at this time.  Signed,  Norberto Sorenson, MSW, LCSWA, LCASA 08/06/2023 3:58 PM

## 2023-08-07 ENCOUNTER — Encounter (HOSPITAL_COMMUNITY): Payer: Self-pay | Admitting: Obstetrics and Gynecology

## 2023-08-07 NOTE — Lactation Note (Signed)
This note was copied from a baby's chart. Lactation Consultation Note  Patient Name: Victoria Foster [id number] Date: 08/07/2023 Age:37 hours Reason for consult: Follow-up assessment;1st time breastfeeding  P1, Baby sleeping in mother's arms after recent 20 min feeding. Encouraged mother to feed on demand with cues.  Mother states baby cluster fed last night.  Provided education that this is normal newborn feeding behavior. Suggest calling for help as needed.    Maternal Data Has patient been taught Hand Expression?: Yes  Feeding Mother's Current Feeding Choice: Breast Milk   Interventions Interventions: Breast feeding basics reviewed;Education  Discharge Pump: Personal;DEBP (Medela)  Consult Status Consult Status: Follow-up Date: 08/08/23 Follow-up type: In-patient    Dahlia Byes [facility] 08/07/2023, 9:11 AM

## 2023-08-07 NOTE — Progress Notes (Signed)
Subjective: Postpartum Day 1: Cesarean Delivery Patient reports tolerating PO and no problems voiding.    Objective: Vital signs in last 24 hours: Temp:  [97.8 F (36.6 C)-98.2 F (36.8 C)] 97.8 F (36.6 C) (12/16 0506) Pulse Rate:  [63-73] 73 (12/16 0506) Resp:  [18-20] 18 (12/16 0506) BP: (101-109)/(71-81) 109/71 (12/16 0506) SpO2:  [96 %-99 %] 96 % (12/16 0506)  Physical Exam:  General: alert Lochia: appropriate Uterine Fundus: firm Incision: healing well DVT Evaluation: No evidence of DVT seen on physical exam.  Recent Labs    08/05/23 0302 08/06/23 0452  HGB 10.6* 10.1*  HCT 32.5* 30.2*    Assessment/Plan: Status post Cesarean section. Doing well postoperatively.  Continue current care.   Ranae Pila, MD 08/07/2023, 9:15 AM

## 2023-08-07 NOTE — Plan of Care (Signed)
Problem: Education: Goal: Knowledge of General Education information will improve Description: Including pain rating scale, medication(s)/side effects and non-pharmacologic comfort measures 08/07/2023 0720 by Donne Hazel, LPN Outcome: Progressing 08/07/2023 0720 by Donne Hazel, LPN Outcome: Progressing   Problem: Health Behavior/Discharge Planning: Goal: Ability to manage health-related needs will improve 08/07/2023 0720 by Donne Hazel, LPN Outcome: Progressing 08/07/2023 0720 by Donne Hazel, LPN Outcome: Progressing   Problem: Clinical Measurements: Goal: Ability to maintain clinical measurements within normal limits will improve 08/07/2023 0720 by Donne Hazel, LPN Outcome: Progressing 08/07/2023 0720 by Donne Hazel, LPN Outcome: Progressing Goal: Will remain free from infection 08/07/2023 0720 by Donne Hazel, LPN Outcome: Progressing 08/07/2023 0720 by Donne Hazel, LPN Outcome: Progressing Goal: Diagnostic test results will improve 08/07/2023 0720 by Donne Hazel, LPN Outcome: Progressing 08/07/2023 0720 by Donne Hazel, LPN Outcome: Progressing Goal: Respiratory complications will improve 08/07/2023 0720 by Donne Hazel, LPN Outcome: Progressing 08/07/2023 0720 by Donne Hazel, LPN Outcome: Progressing Goal: Cardiovascular complication will be avoided 08/07/2023 0720 by Donne Hazel, LPN Outcome: Progressing 08/07/2023 0720 by Donne Hazel, LPN Outcome: Progressing   Problem: Activity: Goal: Risk for activity intolerance will decrease 08/07/2023 0720 by Donne Hazel, LPN Outcome: Progressing 08/07/2023 0720 by Donne Hazel, LPN Outcome: Progressing   Problem: Nutrition: Goal: Adequate nutrition will be maintained 08/07/2023 0720 by Donne Hazel, LPN Outcome: Progressing 08/07/2023 0720 by Donne Hazel, LPN Outcome: Progressing   Problem: Coping: Goal: Level of anxiety will decrease 08/07/2023 0720 by Donne Hazel, LPN Outcome: Progressing 08/07/2023 0720 by Donne Hazel, LPN Outcome: Progressing   Problem: Elimination: Goal: Will not experience complications related to bowel motility Outcome: Progressing Goal: Will not experience complications related to urinary retention Outcome: Progressing   Problem: Pain Management: Goal: General experience of comfort will improve Outcome: Progressing   Problem: Safety: Goal: Ability to remain free from injury will improve Outcome: Progressing   Problem: Skin Integrity: Goal: Risk for impaired skin integrity will decrease Outcome: Progressing   Problem: Education: Goal: Knowledge of Childbirth will improve Outcome: Progressing Goal: Ability to make informed decisions regarding treatment and plan of care will improve Outcome: Progressing Goal: Ability to state and carry out methods to decrease the pain will improve Outcome: Progressing Goal: Individualized Educational Video(s) Outcome: Progressing   Problem: Coping: Goal: Ability to verbalize concerns and feelings about labor and delivery will improve Outcome: Progressing   Problem: Life Cycle: Goal: Ability to make normal progression through stages of labor will improve Outcome: Progressing Goal: Ability to effectively push during vaginal delivery will improve Outcome: Progressing   Problem: Role Relationship: Goal: Will demonstrate positive interactions with the child Outcome: Progressing   Problem: Safety: Goal: Risk of complications during labor and delivery will decrease Outcome: Progressing   Problem: Pain Management: Goal: Relief or control of pain from uterine contractions will improve Outcome: Progressing   Problem: Education: Goal: Knowledge of condition will improve Outcome: Progressing Goal: Individualized Educational Video(s) Outcome: Progressing Goal: Individualized Newborn Educational Video(s) Outcome: Progressing   Problem: Activity: Goal: Will  verbalize the importance of balancing activity with adequate rest periods Outcome: Progressing Goal: Ability to tolerate increased activity will improve Outcome: Progressing   Problem: Coping: Goal: Ability to identify and utilize available resources and services will improve Outcome: Progressing   Problem: Life Cycle: Goal: Chance of risk for complications during the postpartum period will decrease Outcome: Progressing   Problem: Role  Relationship: Goal: Ability to demonstrate positive interaction with newborn will improve Outcome: Progressing   Problem: Skin Integrity: Goal: Demonstration of wound healing without infection will improve Outcome: Progressing   Problem: Education: Goal: Knowledge of the prescribed therapeutic regimen will improve Outcome: Progressing Goal: Understanding of sexual limitations or changes related to disease process or condition will improve Outcome: Progressing Goal: Individualized Educational Video(s) Outcome: Progressing   Problem: Self-Concept: Goal: Communication of feelings regarding changes in body function or appearance will improve Outcome: Progressing   Problem: Skin Integrity: Goal: Demonstration of wound healing without infection will improve Outcome: Progressing

## 2023-08-08 MED ORDER — IBUPROFEN 600 MG PO TABS: 600.0000 mg | ORAL_TABLET | Freq: Four times a day (QID) | ORAL | 0 refills | Status: AC

## 2023-08-08 MED ORDER — OXYCODONE HCL 5 MG PO TABS: 5.0000 mg | ORAL_TABLET | ORAL | 0 refills | Status: AC | PRN

## 2023-08-08 NOTE — Social Work (Signed)
CSW received and acknowledges consult for EDPS of 9.  Consult screened out due to 9 on EDPS does not warrant a CSW consult.  [name] whom scores are greater than 9/yes to question 10 on [place] Postpartum Depression Screen warrants a CSW consult.   Aniyia Rane, LCSWA Clinical Social Worker [phone]  

## 2023-08-08 NOTE — Plan of Care (Signed)
  Problem: Education: Goal: Knowledge of General Education information will improve Description: Including pain rating scale, medication(s)/side effects and non-pharmacologic comfort measures Outcome: Completed/Met   Problem: Clinical Measurements: Goal: Ability to maintain clinical measurements within normal limits will improve Outcome: Completed/Met Goal: Will remain free from infection Outcome: Completed/Met Goal: Diagnostic test results will improve Outcome: Completed/Met Goal: Respiratory complications will improve Outcome: Completed/Met Goal: Cardiovascular complication will be avoided Outcome: Completed/Met

## 2023-08-08 NOTE — Progress Notes (Signed)
Subjective: Postpartum Day 3: Cesarean Delivery Patient reports tolerating PO, + flatus, and no problems voiding.    Objective: Vital signs in last 24 hours: Temp:  [97 F (36.1 C)-98.1 F (36.7 C)] 97 F (36.1 C) (12/17 0500) Pulse Rate:  [61-79] 61 (12/17 0500) Resp:  [18] 18 (12/17 0500) BP: (119-127)/(81-83) 127/82 (12/17 0500) SpO2:  [98 %] 98 % (12/17 0500)  Physical Exam:  General: alert, cooperative, and appears stated age 37: appropriate Uterine Fundus: firm Incision: healing well, no significant drainage, no dehiscence DVT Evaluation: No evidence of DVT seen on physical exam. Negative Homan's sign. No cords or calf tenderness.  Recent Labs    08/06/23 0452  HGB 10.1*  HCT 30.2*    Assessment/Plan: Status post Cesarean section. Doing well postoperatively.  Discharge home with standard precautions and return to clinic in 4-6 weeks.  Mitchel Honour, DO 08/08/2023, 9:13 AM

## 2023-08-08 NOTE — Plan of Care (Signed)
  Problem: Education: Goal: Knowledge of General Education information will improve Description: Including pain rating scale, medication(s)/side effects and non-pharmacologic comfort measures Outcome: Completed/Met   Problem: Education: Goal: Knowledge of General Education information will improve Description: Including pain rating scale, medication(s)/side effects and non-pharmacologic comfort measures Outcome: Completed/Met   Problem: Clinical Measurements: Goal: Ability to maintain clinical measurements within normal limits will improve Outcome: Completed/Met Goal: Will remain free from infection Outcome: Completed/Met Goal: Diagnostic test results will improve Outcome: Completed/Met Goal: Respiratory complications will improve Outcome: Completed/Met Goal: Cardiovascular complication will be avoided Outcome: Completed/Met

## 2023-08-08 NOTE — Discharge Summary (Signed)
Postpartum Discharge Summary    Patient Name: Victoria Foster DOB: 08/12/1986 MRN: [id number]  Date of admission: 08/05/2023 Delivery date:08/05/2023 Delivering provider: Marcelle Overlie Date of discharge: 08/08/2023  Admitting diagnosis: Labor and delivery, indication for care [O75.9] S/P cesarean section [Z98.891] Intrauterine pregnancy: [redacted]w[redacted]d     Secondary diagnosis:  Principal Problem:   Labor and delivery, indication for care Active Problems:   S/P cesarean section  Additional problems: PROM    Discharge diagnosis: Term Pregnancy Delivered                                              Post partum procedures: none Augmentation: Cytotec Complications: None  Hospital course: Induction of Labor With Cesarean Section   37 y.o. yo [id number] at [redacted]w[redacted]d was admitted to the hospital 08/05/2023 for induction of labor. Patient had a labor course significant for  non-reassuring fetal monitoring. The patient went for cesarean section due to Arrest of Dilation and Non-Reassuring FHR. Delivery details are as follows: Membrane Rupture Time/Date: 1:15 AM,08/05/2023  Delivery Method:C-Section, Vacuum Assisted Operative Delivery:N/A Details of operation can be found in separate operative Note.  Patient had a postpartum course complicated by n/a. She is ambulating, tolerating a regular diet, passing flatus, and urinating well.  Patient is discharged home in stable condition on 08/08/23.      Newborn Data: Birth date:08/05/2023 Birth time:10:04 PM Gender:Female Living status:Living Apgars: ,  Weight:3330 g                               Magnesium Sulfate received: No BMZ received: No Rhophylac:No MMR:No T-DaP:Given prenatally Flu: No RSV Vaccine received: No Transfusion:No  Immunizations received:  There is no immunization history on file for this patient.  Physical exam  Vitals:   08/07/23 0506 08/07/23 1344 08/07/23 2256 08/08/23 0500  BP: 109/71 119/81 127/83 127/82   Pulse: 73 76 79 61  Resp: 18 18 18 18   Temp: 97.8 F (36.6 C) 97.8 F (36.6 C) 98.1 F (36.7 C) (!) 97 F (36.1 C)  TempSrc: Oral Oral Oral Oral  SpO2: 96%  98% 98%  Weight:      Height:       General: alert, cooperative, and no distress Lochia: appropriate Uterine Fundus: firm Incision: Healing well with no significant drainage, No significant erythema, Dressing is clean, dry, and intact DVT Evaluation: No evidence of DVT seen on physical exam. Negative Homan's sign. No cords or calf tenderness. Labs: Lab Results  Component Value Date   WBC 16.9 (H) 08/06/2023   HGB 10.1 (L) 08/06/2023   HCT 30.2 (L) 08/06/2023   MCV 90.1 08/06/2023   PLT 253 08/06/2023      Latest Ref Rng & Units 08/05/2023    3:02 AM  CMP  Glucose 70 - 99 mg/dL 88   BUN 6 - 20 mg/dL 13   Creatinine 9.32 - 1.00 mg/dL 3.55   Sodium 732 - 202 mmol/L 140   Potassium 3.5 - 5.1 mmol/L 4.0   Chloride 98 - 111 mmol/L 111   CO2 22 - 32 mmol/L 20   Calcium 8.9 - 10.3 mg/dL 8.8   Total Protein 6.5 - 8.1 g/dL 6.4   Total Bilirubin <5.4 mg/dL 0.6   Alkaline Phos 38 - 126 U/L 120   AST 15 -  41 U/L 24   ALT 0 - 44 U/L <5    Edinburgh Score:    08/07/2023    5:58 PM  Edinburgh Postnatal Depression Scale Screening Tool  I have been able to laugh and see the funny side of things. 0  I have looked forward with enjoyment to things. 0  I have blamed myself unnecessarily when things went wrong. 2  I have been anxious or worried for no good reason. 3  I have felt scared or panicky for no good reason. 2  Things have been getting on top of me. 0  I have been so unhappy that I have had difficulty sleeping. 0  I have felt sad or miserable. 1  I have been so unhappy that I have been crying. 1  The thought of harming myself has occurred to me. 0  Edinburgh Postnatal Depression Scale Total 9   Edinburgh Postnatal Depression Scale Total: 9   After visit meds:  Allergies as of 08/08/2023   No Known  Allergies      Medication List     STOP taking these medications    amoxicillin 875 MG tablet Commonly known as: AMOXIL   amphetamine-dextroamphetamine 15 MG tablet Commonly known as: ADDERALL   letrozole 2.5 MG tablet Commonly known as: FEMARA   omeprazole 20 MG capsule Commonly known as: PRILOSEC   traMADol 50 MG tablet Commonly known as: ULTRAM       TAKE these medications    ibuprofen 600 MG tablet Commonly known as: ADVIL Take 1 tablet (600 mg total) by mouth every 6 (six) hours. What changed:  when to take this reasons to take this   levothyroxine 100 MCG tablet Commonly known as: SYNTHROID Take 100 mcg by mouth daily.   oxyCODONE 5 MG immediate release tablet Commonly known as: Oxy IR/ROXICODONE Take 1 tablet (5 mg total) by mouth every 4 (four) hours as needed for up to 7 days for moderate pain (pain score 4-6).         Discharge home in stable condition Infant Feeding: Bottle and Breast Infant Disposition:home with mother Discharge instruction: per After Visit Summary and Postpartum booklet. Activity: Advance as tolerated. Pelvic rest for 6 weeks.  Diet: routine diet Future Appointments:No future appointments. Follow up Visit: 6 weeks PPV  08/08/2023 Mitchel Honour, DO

## 2023-08-08 NOTE — Discharge Instructions (Signed)
Call MD for T>100.4, heavy vaginal bleeding, severe abdominal pain, intractable nausea and/or vomiting, or respiratory distress.  Call office to schedule postpartum visit in 6 weeks.  Pelvic rest x 6 weeks.  No driving while taking narcotics.  No heavy lifting.   

## 2023-08-08 NOTE — Lactation Note (Signed)
This note was copied from a baby's chart. Lactation Consultation Note  Patient Name: Victoria Foster [id number] Date: 08/08/2023 Age:37 hours Reason for consult: Follow-up assessment  Per mom started formula due to the baby not being satisfied.  LC recommended  until the milk comes in,  Breast massage, hand express, pre- pump with hand pump, and reverse pressure and latch.  Supplement with 30 ml EBM or formula.  Post pump both breast for 15 mins and the save the milk for the next feeding.  LC recommended following the [facility] plan until the milk comes in and then feed more at the breast .  LC reviewed the BF D/C teaching and the [facility] resources.     Maternal Data Does the patient have breastfeeding experience prior to this delivery?: No  Feeding Mother's Current Feeding Choice: Breast Milk and Formula Nipple Type: Slow - flow  LATCH Score Done RN  Latch: Grasps breast easily, tongue down, lips flanged, rhythmical sucking.  Audible Swallowing: A few with stimulation  Type of Nipple: Everted at rest and after stimulation  Comfort (Breast/Nipple): Soft / non-tender  Hold (Positioning): Assistance needed to correctly position infant at breast and maintain latch.  LATCH Score: 8   Lactation Tools Discussed/Used Tools: Pump;Flanges Flange Size: 24 Breast pump type: Manual Pump Education: Milk Storage;Other (comment) (hand pump provided to mom prior to this LC)  Interventions Interventions: Breast feeding basics reviewed;Education;CDC Guidelines for Breast Pump Cleaning  Discharge Discharge Education: Engorgement and breast care;Warning signs for feeding baby Pump: Personal;DEBP;Manual  Consult Status Consult Status: Complete Date: 08/08/23    Kathrin Greathouse 08/08/2023, 11:58 AM

## 2023-08-12 ENCOUNTER — Inpatient Hospital Stay (HOSPITAL_COMMUNITY)
Admission: AD | Admit: 2023-08-12 | Payer: Medicaid Other | Source: Home / Self Care | Admitting: Obstetrics and Gynecology

## 2023-08-12 ENCOUNTER — Inpatient Hospital Stay (HOSPITAL_COMMUNITY): Payer: Medicaid Other

## 2023-08-12 ENCOUNTER — Telehealth (HOSPITAL_COMMUNITY): Payer: Self-pay

## 2023-08-12 NOTE — Telephone Encounter (Signed)
08/12/2023 1421  Name: Victoria Foster MRN: [id number] DOB: 1986/02/04  Reason for Call:  Transition of Care Hospital Discharge Call  Contact Status: Patient Contact Status: Complete  Language assistant needed:          Follow-Up Questions: Do You Have Any Concerns About Your Health As You Heal From Delivery?: No Do You Have Any Concerns About Your Infants Health?: No  Edinburgh Postnatal Depression Scale:  In the Past 7 Days:    PHQ2-9 Depression Scale:     Discharge Follow-up: Edinburgh score requires follow up?: No (Patient states that she did EPDS 2 days ago with her Pediatrician and scored a 6 or 7. She declines EPDS today. She states that has had reduced anxiety since being home and is doing well.) Patient was advised of the following resources:: Breastfeeding Support Group, Support Group  Post-discharge interventions: Reviewed Newborn Safe Sleep Practices  Signature  Signe Colt
